# Patient Record
Sex: Female | Born: 1935 | Race: White | Hispanic: No | Smoking: Current every day smoker
Health system: Southern US, Community
[De-identification: ages and names within clinical notes are randomized; demographics above are authoritative.]

## PROBLEM LIST (undated history)

## (undated) DIAGNOSIS — M1990 Unspecified osteoarthritis, unspecified site: Secondary | ICD-10-CM

## (undated) DIAGNOSIS — J449 Chronic obstructive pulmonary disease, unspecified: Secondary | ICD-10-CM

## (undated) HISTORY — PX: EYE SURGERY: SHX253

---

## 2004-01-09 ENCOUNTER — Emergency Department (HOSPITAL_COMMUNITY): Admission: EM | Admit: 2004-01-09 | Discharge: 2004-01-09 | Payer: Self-pay | Admitting: Emergency Medicine

## 2011-08-22 ENCOUNTER — Encounter (HOSPITAL_COMMUNITY): Payer: Self-pay | Admitting: Emergency Medicine

## 2011-08-22 ENCOUNTER — Emergency Department (HOSPITAL_COMMUNITY): Payer: Medicare Other

## 2011-08-22 ENCOUNTER — Other Ambulatory Visit: Payer: Self-pay

## 2011-08-22 ENCOUNTER — Emergency Department (HOSPITAL_COMMUNITY)
Admission: EM | Admit: 2011-08-22 | Discharge: 2011-08-23 | Disposition: A | Discharge status: Home / Self Care | Payer: Medicare Other | Attending: Emergency Medicine | Admitting: Emergency Medicine

## 2011-08-22 DIAGNOSIS — J4489 Other specified chronic obstructive pulmonary disease: Secondary | ICD-10-CM | POA: Insufficient documentation

## 2011-08-22 DIAGNOSIS — R059 Cough, unspecified: Secondary | ICD-10-CM | POA: Insufficient documentation

## 2011-08-22 DIAGNOSIS — R509 Fever, unspecified: Secondary | ICD-10-CM | POA: Insufficient documentation

## 2011-08-22 DIAGNOSIS — R05 Cough: Secondary | ICD-10-CM | POA: Insufficient documentation

## 2011-08-22 DIAGNOSIS — R4182 Altered mental status, unspecified: Secondary | ICD-10-CM | POA: Insufficient documentation

## 2011-08-22 DIAGNOSIS — J40 Bronchitis, not specified as acute or chronic: Secondary | ICD-10-CM | POA: Insufficient documentation

## 2011-08-22 DIAGNOSIS — J449 Chronic obstructive pulmonary disease, unspecified: Secondary | ICD-10-CM | POA: Insufficient documentation

## 2011-08-22 DIAGNOSIS — F17200 Nicotine dependence, unspecified, uncomplicated: Secondary | ICD-10-CM | POA: Insufficient documentation

## 2011-08-22 DIAGNOSIS — R000 Tachycardia, unspecified: Secondary | ICD-10-CM | POA: Insufficient documentation

## 2011-08-22 DIAGNOSIS — H538 Other visual disturbances: Secondary | ICD-10-CM | POA: Insufficient documentation

## 2011-08-22 DIAGNOSIS — M542 Cervicalgia: Secondary | ICD-10-CM | POA: Insufficient documentation

## 2011-08-22 LAB — URINALYSIS, ROUTINE W REFLEX MICROSCOPIC
Glucose, UA: NEGATIVE mg/dL
Ketones, ur: NEGATIVE mg/dL
Nitrite: NEGATIVE
Protein, ur: NEGATIVE mg/dL
Specific Gravity, Urine: 1.012 (ref 1.005–1.030)

## 2011-08-22 LAB — CBC
HCT: 39 % (ref 36.0–46.0)
Hemoglobin: 14.1 g/dL (ref 12.0–15.0)
MCV: 93.5 fL (ref 78.0–100.0)
WBC: 8.6 10*3/uL (ref 4.0–10.5)

## 2011-08-22 LAB — COMPREHENSIVE METABOLIC PANEL
AST: 22 U/L (ref 0–37)
Albumin: 4.2 g/dL (ref 3.5–5.2)
BUN: 5 mg/dL — ABNORMAL LOW (ref 6–23)
CO2: 24 mEq/L (ref 19–32)
Calcium: 9.6 mg/dL (ref 8.4–10.5)
Chloride: 93 mEq/L — ABNORMAL LOW (ref 96–112)
Creatinine, Ser: 0.46 mg/dL — ABNORMAL LOW (ref 0.50–1.10)
GFR calc Af Amer: >90 mL/min (ref >90)
Glucose, Bld: 103 mg/dL — ABNORMAL HIGH (ref 70–99)
Sodium: 128 mEq/L — ABNORMAL LOW (ref 135–145)
Total Protein: 7.1 g/dL (ref 6.0–8.3)

## 2011-08-22 LAB — DIFFERENTIAL
Eosinophils Relative: 0 % (ref 0–5)
Lymphs Abs: 1.5 10*3/uL (ref 0.7–4.0)
Monocytes Relative: 12 % (ref 3–12)

## 2011-08-22 LAB — URINE MICROSCOPIC-ADD ON

## 2011-08-22 MED ORDER — LORAZEPAM 2 MG/ML IJ SOLN
1.0000 mg | Freq: Once | INTRAMUSCULAR | Status: AC
  Administered 2011-08-22: 1 mg via INTRAVENOUS
  Filled 2011-08-22: qty 1

## 2011-08-22 MED ORDER — SODIUM CHLORIDE 0.9 % IV BOLUS (SEPSIS)
1000.0000 mL | Freq: Once | INTRAVENOUS | Status: AC
  Administered 2011-08-22: 1000 mL via INTRAVENOUS

## 2011-08-22 NOTE — ED Notes (Signed)
MD at bedside. 

## 2011-08-22 NOTE — ED Notes (Addendum)
Pt c/o uncontrolled shaking and a pain in neck that start last night. Pt has been ill for over a month. Pt also c/o of blurred vision that started over a month ago.

## 2011-08-22 NOTE — ED Provider Notes (Signed)
History     CSN: [id number]  Arrival date & time 08/22/11  2026   First MD Initiated Contact with Patient 08/22/11 2137      Chief Complaint  Patient presents with  . Shaking  . Blurred Vision  . Neck Pain  . Shoulder Pain    Patient is a 77 y.o. female presenting with neck pain and shoulder pain.  Neck Pain   Shoulder Pain   The patient presents with several days of generalized discomfort and new chills.  She has a long history of visual issues, including cataracts, macular degeneration.  She notes over the past week she has had progressive decline in her visual capacity, without acute precipitant for alleviating factors. Several days ago she gradually became aware of generalized discomfort as well, notes tightness about her back and neck.  Today she developed chills. She has not taken any new medications recently, nor has she tried any medications to make to alleviate her symptoms. She denies any nausea, vomiting, diarrhea, dyspnea, chest pain.  No past medical history on file.  Past Surgical History  Procedure Date  . Eye surgery     No family history on file.  History  Substance Use Topics  . Smoking status: Current Everyday Smoker -- 0.5 packs/day for 60 years    Types: Cigarettes  . Smokeless tobacco: Former Neurosurgeon    Types: Snuff  . Alcohol Use: 4.2 oz/week    7 Cans of beer per week    OB History    Grav Para Term Preterm Abortions TAB SAB Ect Mult Living                  Review of Systems  Constitutional:       HPI  HENT: Positive for neck pain.        HPI otherwise negative  Eyes: Negative.   Respiratory:       HPI, otherwise negative  Cardiovascular:       HPI, otherwise nmegative  Gastrointestinal: Negative for vomiting.  Genitourinary:       HPI, otherwise negative  Musculoskeletal:       HPI, otherwise negative  Skin: Negative.   Neurological: Negative for syncope.    Allergies  Review of patient's allergies indicates no known  allergies.  Home Medications   Current Outpatient Rx  Name Route Sig Dispense Refill  . SIMVASTATIN 20 MG PO TABS Oral Take 20 mg by mouth every evening.    . ASPIRIN 325 MG PO TABS Oral Take 650 mg by mouth daily.      BP 184/89  Pulse 107  Temp(Src) 99.3 F (37.4 C) (Rectal)  Resp 22  Ht 5\' 4"  (1.626 m)  Wt 137 lb (62.143 kg)  BMI 23.52 kg/m2  SpO2 96%  Physical Exam  Nursing note and vitals reviewed. Constitutional: She is oriented to person, place, and time. She appears well-developed and well-nourished. No distress.  HENT:  Head: Normocephalic and atraumatic.  Eyes: Conjunctivae and EOM are normal. Pupils are equal, round, and reactive to light.  Cardiovascular: Regular rhythm.  Tachycardia present.   Pulmonary/Chest: Effort normal and breath sounds normal. No stridor. No respiratory distress.  Abdominal: She exhibits no distension.  Musculoskeletal: She exhibits no edema.  Neurological: She is alert and oriented to person, place, and time. No cranial nerve deficit. She exhibits normal muscle tone.  Skin: Skin is warm and dry.  Psychiatric: She has a normal mood and affect. Her speech is normal and behavior is  normal. Thought content normal.    ED Course  Procedures (including critical care time)  Labs Reviewed  GLUCOSE, CAPILLARY - Abnormal; Notable for the following:    Glucose-Capillary 131 (*)    All other components within normal limits  URINALYSIS, ROUTINE W REFLEX MICROSCOPIC  CBC  DIFFERENTIAL  COMPREHENSIVE METABOLIC PANEL  TROPONIN I   No results found.   No diagnosis found.   Date: 08/22/2011  Rate: 108  Rhythm: sinus tachycardia  QRS Axis: normal  Intervals: normal  ST/T Wave abnormalities: nonspecific T wave changes  Conduction Disutrbances:none  Narrative Interpretation:   Old EKG Reviewed: none available ABNORMAL  C-monitor: 95-sr, normal Pulse ox 99% ra- normal  Ct, cxr both reviewed by me.  The patient was encouraged to  continue her efforts to stop smoking MDM  This 6 female with a history of macular degeneration now presents with chills, generalized discomfort.  On exam the patient is alert, oriented appropriately, has mild diaphoresis, has cough.  The patient's x-rays consistent with acute bronchitis.  Given her smoking history, there is concern for acute COPD exacerbation.  The patient was discharged with steroids, antibiotics, albuterol inhaler to follow up with her primary care physician.  Gerhard Munch, MD 08/23/11 [phone]

## 2011-08-23 MED ORDER — AZITHROMYCIN 250 MG PO TABS
500.0000 mg | ORAL_TABLET | Freq: Once | ORAL | Status: AC
  Administered 2011-08-23: 500 mg via ORAL
  Filled 2011-08-23: qty 2

## 2011-08-23 MED ORDER — PREDNISONE 10 MG PO TABS: 60.0000 mg | ORAL_TABLET | Freq: Every day | ORAL | Status: AC

## 2011-08-23 MED ORDER — IOHEXOL 300 MG/ML  SOLN
100.0000 mL | Freq: Once | INTRAMUSCULAR | Status: AC | PRN
  Administered 2011-08-23: 100 mL via INTRAVENOUS

## 2011-08-23 MED ORDER — AZITHROMYCIN 250 MG PO TABS: 250.0000 mg | ORAL_TABLET | Freq: Every day | ORAL | Status: AC

## 2011-08-23 MED ORDER — PREDNISONE 20 MG PO TABS
60.0000 mg | ORAL_TABLET | ORAL | Status: AC
  Administered 2011-08-23: 60 mg via ORAL
  Filled 2011-08-23: qty 3

## 2011-08-23 MED ORDER — ALBUTEROL SULFATE HFA 108 (90 BASE) MCG/ACT IN AERS
1.0000 | INHALATION_SPRAY | RESPIRATORY_TRACT | Status: DC | PRN
  Administered 2011-08-23: 1 via RESPIRATORY_TRACT
  Filled 2011-08-23: qty 6.7

## 2011-08-23 NOTE — Discharge Instructions (Signed)

## 2011-08-23 NOTE — ED Notes (Signed)
Patient transported to CT 

## 2014-07-22 ENCOUNTER — Emergency Department (HOSPITAL_COMMUNITY): Payer: Medicare HMO

## 2014-07-22 ENCOUNTER — Emergency Department (HOSPITAL_COMMUNITY)
Admission: EM | Admit: 2014-07-22 | Discharge: 2014-07-22 | Disposition: A | Discharge status: Home / Self Care | Payer: Medicare HMO | Attending: Emergency Medicine | Admitting: Emergency Medicine

## 2014-07-22 ENCOUNTER — Encounter (HOSPITAL_COMMUNITY): Payer: Self-pay | Admitting: Emergency Medicine

## 2014-07-22 DIAGNOSIS — Z720 Tobacco use: Secondary | ICD-10-CM | POA: Insufficient documentation

## 2014-07-22 DIAGNOSIS — Y9389 Activity, other specified: Secondary | ICD-10-CM | POA: Diagnosis not present

## 2014-07-22 DIAGNOSIS — S0990XA Unspecified injury of head, initial encounter: Secondary | ICD-10-CM

## 2014-07-22 DIAGNOSIS — Y998 Other external cause status: Secondary | ICD-10-CM | POA: Diagnosis not present

## 2014-07-22 DIAGNOSIS — Z7982 Long term (current) use of aspirin: Secondary | ICD-10-CM | POA: Insufficient documentation

## 2014-07-22 DIAGNOSIS — S0181XA Laceration without foreign body of other part of head, initial encounter: Secondary | ICD-10-CM | POA: Diagnosis not present

## 2014-07-22 DIAGNOSIS — Y92410 Unspecified street and highway as the place of occurrence of the external cause: Secondary | ICD-10-CM | POA: Insufficient documentation

## 2014-07-22 MED ORDER — ACETAMINOPHEN 500 MG PO TABS
1000.0000 mg | ORAL_TABLET | Freq: Once | ORAL | Status: AC
  Administered 2014-07-22: 1000 mg via ORAL
  Filled 2014-07-22: qty 2

## 2014-07-22 MED ORDER — HYDROCODONE-ACETAMINOPHEN 5-325 MG PO TABS: 1.0000 | ORAL_TABLET | Freq: Four times a day (QID) | ORAL | Status: DC | PRN

## 2014-07-22 MED ORDER — LIDOCAINE-EPINEPHRINE 2 %-1:100000 IJ SOLN
20.0000 mL | Freq: Once | INTRAMUSCULAR | Status: AC
  Administered 2014-07-22: 20 mL via INTRADERMAL
  Filled 2014-07-22: qty 1

## 2014-07-22 NOTE — Discharge Instructions (Signed)
Facial Laceration ° A facial laceration is a cut on the face. These injuries can be painful and cause bleeding. Lacerations usually heal quickly, but they need special care to reduce scarring. °DIAGNOSIS  °Your health care provider will take a medical history, ask for details about how the injury occurred, and examine the wound to determine how deep the cut is. °TREATMENT  °Some facial lacerations may not require closure. Others may not be able to be closed because of an increased risk of infection. The risk of infection and the chance for successful closure will depend on various factors, including the amount of time since the injury occurred. °The wound may be cleaned to help prevent infection. If closure is appropriate, pain medicines may be given if needed. Your health care provider will use stitches (sutures), wound glue (adhesive), or skin adhesive strips to repair the laceration. These tools bring the skin edges together to allow for faster healing and a better cosmetic outcome. If needed, you may also be given a tetanus shot. °HOME CARE INSTRUCTIONS °· Only take over-the-counter or prescription medicines as directed by your health care provider. °· Follow your health care provider's instructions for wound care. These instructions will vary depending on the technique used for closing the wound. °For Sutures: °· Keep the wound clean and dry.   °· If you were given a bandage (dressing), you should change it at least once a day. Also change the dressing if it becomes wet or dirty, or as directed by your health care provider.   °· Wash the wound with soap and water 2 times a day. Rinse the wound off with water to remove all soap. Pat the wound dry with a clean towel.   °· After cleaning, apply a thin layer of the antibiotic ointment recommended by your health care provider. This will help prevent infection and keep the dressing from sticking.   °· You may shower as usual after the first 24 hours. Do not soak the  wound in water until the sutures are removed.   °· Get your sutures removed as directed by your health care provider. With facial lacerations, sutures should usually be taken out after 4-5 days to avoid stitch marks.   °· Wait a few days after your sutures are removed before applying any makeup. °For Skin Adhesive Strips: °· Keep the wound clean and dry.   °· Do not get the skin adhesive strips wet. You may bathe carefully, using caution to keep the wound dry.   °· If the wound gets wet, pat it dry with a clean towel.   °· Skin adhesive strips will fall off on their own. You may trim the strips as the wound heals. Do not remove skin adhesive strips that are still stuck to the wound. They will fall off in time.   °For Wound Adhesive: °· You may briefly wet your wound in the shower or bath. Do not soak or scrub the wound. Do not swim. Avoid periods of heavy sweating until the skin adhesive has fallen off on its own. After showering or bathing, gently pat the wound dry with a clean towel.   °· Do not apply liquid medicine, cream medicine, ointment medicine, or makeup to your wound while the skin adhesive is in place. This may loosen the film before your wound is healed.   °· If a dressing is placed over the wound, be careful not to apply tape directly over the skin adhesive. This may cause the adhesive to be pulled off before the wound is healed.   °· Avoid   prolonged exposure to sunlight or tanning lamps while the skin adhesive is in place.  The skin adhesive will usually remain in place for 5-10 days, then naturally fall off the skin. Do not pick at the adhesive film.  After Healing: Once the wound has healed, cover the wound with sunscreen during the day for 1 full year. This can help minimize scarring. Exposure to ultraviolet light in the first year will darken the scar. It can take 1-2 years for the scar to lose its redness and to heal completely.  SEEK IMMEDIATE MEDICAL CARE IF:  You have redness, pain, or  swelling around the wound.   You see ayellowish-white fluid (pus) coming from the wound.   You have chills or a fever.  MAKE SURE YOU:  Understand these instructions.  Will watch your condition.  Will get help right away if you are not doing well or get worse. Document Released: 07/15/2004 Document Revised: 03/28/2013 Document Reviewed: 01/18/2013 [facility] Patient Information 2015 [place], [place]. This information is not intended to replace advice given to you by your health care provider. Make sure you discuss any questions you have with your health care provider.  Head Injury You have received a head injury. It does not appear serious at this time. Headaches and vomiting are common following head injury. It should be easy to awaken from sleeping. Sometimes it is necessary for you to stay in the emergency department for a while for observation. Sometimes admission to the hospital may be needed. After injuries such as yours, most problems occur within the first 24 hours, but side effects may occur up to 7-10 days after the injury. It is important for you to carefully monitor your condition and contact your health care provider or seek immediate medical care if there is a change in your condition. WHAT ARE THE TYPES OF HEAD INJURIES? Head injuries can be as minor as a bump. Some head injuries can be more severe. More severe head injuries include:  A jarring injury to the brain (concussion).  A bruise of the brain (contusion). This mean there is bleeding in the brain that can cause swelling.  A cracked skull (skull fracture).  Bleeding in the brain that collects, clots, and forms a bump (hematoma). WHAT CAUSES A HEAD INJURY? A serious head injury is most likely to happen to someone who is in a car wreck and is not wearing a seat belt. Other causes of major head injuries include bicycle or motorcycle accidents, sports injuries, and falls. HOW ARE HEAD INJURIES DIAGNOSED? A complete  history of the event leading to the injury and your current symptoms will be helpful in diagnosing head injuries. Many times, pictures of the brain, such as CT or MRI are needed to see the extent of the injury. Often, an overnight hospital stay is necessary for observation.  WHEN SHOULD I SEEK IMMEDIATE MEDICAL CARE?  You should get help right away if:  You have confusion or drowsiness.  You feel sick to your stomach (nauseous) or have continued, forceful vomiting.  You have dizziness or unsteadiness that is getting worse.  You have severe, continued headaches not relieved by medicine. Only take over-the-counter or prescription medicines for pain, fever, or discomfort as directed by your health care provider.  You do not have normal function of the arms or legs or are unable to walk.  You notice changes in the black spots in the center of the colored part of your eye (pupil).  You have a clear  or bloody fluid coming from your nose or ears. °· You have a loss of vision. °During the next 24 hours after the injury, you must stay with someone who can watch you for the warning signs. This person should contact local emergency services (911 in the [place] if you have seizures, you become unconscious, or you are unable to wake up. °HOW CAN I PREVENT A HEAD INJURY IN THE FUTURE? °The most important factor for preventing major head injuries is avoiding motor vehicle accidents.  To minimize the potential for damage to your head, it is crucial to wear seat belts while riding in motor vehicles. Wearing helmets while bike riding and playing collision sports (like football) is also helpful. Also, avoiding dangerous activities around the house will further help reduce your risk of head injury.  °WHEN CAN I RETURN TO NORMAL ACTIVITIES AND ATHLETICS? °You should be reevaluated by your health care provider before returning to these activities. If you have any of the following symptoms, you should not return to  activities or contact sports until 1 week after the symptoms have stopped: °· Persistent headache. °· Dizziness or vertigo. °· Poor attention and concentration. °· Confusion. °· Memory problems. °· Nausea or vomiting. °· Fatigue or tire easily. °· Irritability. °· Intolerant of bright lights or loud noises. °· Anxiety or depression. °· Disturbed sleep. °MAKE SURE YOU:  °· Understand these instructions. °· Will watch your condition. °· Will get help right away if you are not doing well or get worse. °Document Released: 06/07/2005 Document Revised: 06/12/2013 Document Reviewed: 02/12/2013 °[employer]® Patient Information ©2015 [employer]. This information is not intended to replace advice given to you by your health care provider. Make sure you discuss any questions you have with your health care provider. ° °Motor Vehicle Collision °It is common to have multiple bruises and sore muscles after a motor vehicle collision (MVC). These tend to feel worse for the first 24 hours. You may have the most stiffness and soreness over the first several hours. You may also feel worse when you wake up the first morning after your collision. After this point, you will usually begin to improve with each day. The speed of improvement often depends on the severity of the collision, the number of injuries, and the location and nature of these injuries. °HOME CARE INSTRUCTIONS °· Put ice on the injured area. °¨ Put ice in a plastic bag. °¨ Place a towel between your skin and the bag. °¨ Leave the ice on for 15-20 minutes, 3-4 times a day, or as directed by your health care provider. °· Drink enough fluids to keep your urine clear or pale yellow. Do not drink alcohol. °· Take a warm shower or bath once or twice a day. This will increase blood flow to sore muscles. °· You may return to activities as directed by your caregiver. Be careful when lifting, as this may aggravate neck or back pain. °· Only take over-the-counter or prescription  medicines for pain, discomfort, or fever as directed by your caregiver. Do not use aspirin. This may increase bruising and bleeding. °SEEK IMMEDIATE MEDICAL CARE IF: °· You have numbness, tingling, or weakness in the arms or legs. °· You develop severe headaches not relieved with medicine. °· You have severe neck pain, especially tenderness in the middle of the back of your neck. °· You have changes in bowel or bladder control. °· There is increasing pain in any area of the body. °· You have shortness of breath, light-headedness,   dizziness, or fainting.  You have chest pain.  You feel sick to your stomach (nauseous), throw up (vomit), or sweat.  You have increasing abdominal discomfort.  There is blood in your urine, stool, or vomit.  You have pain in your shoulder (shoulder strap areas).  You feel your symptoms are getting worse. MAKE SURE YOU:  Understand these instructions.  Will watch your condition.  Will get help right away if you are not doing well or get worse. Document Released: 06/07/2005 Document Revised: 10/22/2013 Document Reviewed: 11/04/2010 [facility] Patient Information 2015 [place], [place]. This information is not intended to replace advice given to you by your health care provider. Make sure you discuss any questions you have with your health care provider.

## 2014-07-22 NOTE — ED Notes (Signed)
Patient transported to CT 
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
## 2018-02-07 ENCOUNTER — Other Ambulatory Visit: Payer: Self-pay

## 2018-02-07 ENCOUNTER — Inpatient Hospital Stay (HOSPITAL_BASED_OUTPATIENT_CLINIC_OR_DEPARTMENT_OTHER)
Admission: EM | Admit: 2018-02-07 | Discharge: 2018-02-11 | DRG: 871 | Disposition: A | Discharge status: Home Health | Payer: Medicare HMO | Attending: Internal Medicine | Admitting: Internal Medicine

## 2018-02-07 ENCOUNTER — Emergency Department (HOSPITAL_BASED_OUTPATIENT_CLINIC_OR_DEPARTMENT_OTHER): Payer: Medicare HMO

## 2018-02-07 ENCOUNTER — Encounter (HOSPITAL_BASED_OUTPATIENT_CLINIC_OR_DEPARTMENT_OTHER): Payer: Self-pay | Admitting: Emergency Medicine

## 2018-02-07 DIAGNOSIS — A419 Sepsis, unspecified organism: Secondary | ICD-10-CM | POA: Diagnosis present

## 2018-02-07 DIAGNOSIS — J441 Chronic obstructive pulmonary disease with (acute) exacerbation: Secondary | ICD-10-CM | POA: Diagnosis present

## 2018-02-07 DIAGNOSIS — E440 Moderate protein-calorie malnutrition: Secondary | ICD-10-CM | POA: Diagnosis present

## 2018-02-07 DIAGNOSIS — J440 Chronic obstructive pulmonary disease with acute lower respiratory infection: Secondary | ICD-10-CM | POA: Diagnosis present

## 2018-02-07 DIAGNOSIS — L89152 Pressure ulcer of sacral region, stage 2: Secondary | ICD-10-CM | POA: Diagnosis present

## 2018-02-07 DIAGNOSIS — Z7982 Long term (current) use of aspirin: Secondary | ICD-10-CM | POA: Diagnosis not present

## 2018-02-07 DIAGNOSIS — J181 Lobar pneumonia, unspecified organism: Secondary | ICD-10-CM | POA: Diagnosis present

## 2018-02-07 DIAGNOSIS — R000 Tachycardia, unspecified: Secondary | ICD-10-CM | POA: Diagnosis not present

## 2018-02-07 DIAGNOSIS — Z681 Body mass index (BMI) 19 or less, adult: Secondary | ICD-10-CM | POA: Diagnosis not present

## 2018-02-07 DIAGNOSIS — J9601 Acute respiratory failure with hypoxia: Secondary | ICD-10-CM | POA: Diagnosis present

## 2018-02-07 DIAGNOSIS — B373 Candidiasis of vulva and vagina: Secondary | ICD-10-CM | POA: Diagnosis present

## 2018-02-07 DIAGNOSIS — R0602 Shortness of breath: Secondary | ICD-10-CM | POA: Diagnosis present

## 2018-02-07 DIAGNOSIS — F17210 Nicotine dependence, cigarettes, uncomplicated: Secondary | ICD-10-CM | POA: Diagnosis present

## 2018-02-07 DIAGNOSIS — L89322 Pressure ulcer of left buttock, stage 2: Secondary | ICD-10-CM | POA: Diagnosis present

## 2018-02-07 DIAGNOSIS — J189 Pneumonia, unspecified organism: Secondary | ICD-10-CM | POA: Diagnosis present

## 2018-02-07 DIAGNOSIS — L89312 Pressure ulcer of right buttock, stage 2: Secondary | ICD-10-CM | POA: Diagnosis present

## 2018-02-07 DIAGNOSIS — L8990 Pressure ulcer of unspecified site, unspecified stage: Secondary | ICD-10-CM

## 2018-02-07 HISTORY — DX: Chronic obstructive pulmonary disease, unspecified: J44.9

## 2018-02-07 HISTORY — DX: Unspecified osteoarthritis, unspecified site: M19.90

## 2018-02-07 LAB — URINALYSIS, ROUTINE W REFLEX MICROSCOPIC
Glucose, UA: NEGATIVE mg/dL
KETONES UR: 15 mg/dL — AB
Nitrite: NEGATIVE
PH: 6 (ref 5.0–8.0)
Protein, ur: 100 mg/dL — AB
Specific Gravity, Urine: >1.03 — ABNORMAL HIGH (ref 1.005–1.030)

## 2018-02-07 LAB — COMPREHENSIVE METABOLIC PANEL
ALT: 15 U/L (ref 0–44)
ANION GAP: 11 (ref 5–15)
AST: 24 U/L (ref 15–41)
Albumin: 3.4 g/dL — ABNORMAL LOW (ref 3.5–5.0)
Alkaline Phosphatase: 77 U/L (ref 38–126)
BUN: 18 mg/dL (ref 8–23)
CHLORIDE: 102 mmol/L (ref 98–111)
CO2: 25 mmol/L (ref 22–32)
Calcium: 8.7 mg/dL — ABNORMAL LOW (ref 8.9–10.3)
Creatinine, Ser: 0.74 mg/dL (ref 0.44–1.00)
Glucose, Bld: 176 mg/dL — ABNORMAL HIGH (ref 70–99)
Potassium: 3.5 mmol/L (ref 3.5–5.1)
SODIUM: 138 mmol/L (ref 135–145)
Total Bilirubin: 0.9 mg/dL (ref 0.3–1.2)
Total Protein: 7.2 g/dL (ref 6.5–8.1)

## 2018-02-07 LAB — CBC WITH DIFFERENTIAL/PLATELET
Basophils Absolute: 0 10*3/uL (ref 0.0–0.1)
Basophils Relative: 0 %
EOS ABS: 0 10*3/uL (ref 0.0–0.7)
EOS PCT: 0 %
HCT: 38.3 % (ref 36.0–46.0)
HEMOGLOBIN: 13.4 g/dL (ref 12.0–15.0)
LYMPHS ABS: 0.8 10*3/uL (ref 0.7–4.0)
LYMPHS PCT: 7 %
MCH: 32.4 pg (ref 26.0–34.0)
MCHC: 35 g/dL (ref 30.0–36.0)
MCV: 92.7 fL (ref 78.0–100.0)
Monocytes Absolute: 1.4 10*3/uL — ABNORMAL HIGH (ref 0.1–1.0)
Monocytes Relative: 12 %
NEUTROS PCT: 81 %
Neutro Abs: 9.3 10*3/uL — ABNORMAL HIGH (ref 1.7–7.7)
PLATELETS: 257 10*3/uL (ref 150–400)
RBC: 4.13 MIL/uL (ref 3.87–5.11)
RDW: 12.9 % (ref 11.5–15.5)
WBC: 11.5 10*3/uL — AB (ref 4.0–10.5)

## 2018-02-07 LAB — PROTIME-INR
INR: 1.06
PROTHROMBIN TIME: 13.7 s (ref 11.4–15.2)

## 2018-02-07 LAB — URINALYSIS, MICROSCOPIC (REFLEX)

## 2018-02-07 LAB — I-STAT CG4 LACTIC ACID, ED: LACTIC ACID, VENOUS: 1.62 mmol/L (ref 0.5–1.9)

## 2018-02-07 MED ORDER — AZITHROMYCIN 500 MG IV SOLR
INTRAVENOUS | Status: AC
  Filled 2018-02-07: qty 500

## 2018-02-07 MED ORDER — SODIUM CHLORIDE 0.9 % IV SOLN
INTRAVENOUS | Status: DC | PRN
  Administered 2018-02-07: 1000 mL via INTRAVENOUS

## 2018-02-07 MED ORDER — IPRATROPIUM-ALBUTEROL 0.5-2.5 (3) MG/3ML IN SOLN
3.0000 mL | RESPIRATORY_TRACT | Status: AC
  Administered 2018-02-07 (×2): 3 mL via RESPIRATORY_TRACT
  Filled 2018-02-07: qty 9

## 2018-02-07 MED ORDER — SODIUM CHLORIDE 0.9 % IV SOLN
500.0000 mg | Freq: Once | INTRAVENOUS | Status: AC
  Administered 2018-02-07: 500 mg via INTRAVENOUS
  Filled 2018-02-07: qty 500

## 2018-02-07 MED ORDER — SODIUM CHLORIDE 0.9 % IV BOLUS (SEPSIS)
500.0000 mL | Freq: Once | INTRAVENOUS | Status: AC
  Administered 2018-02-07: 500 mL via INTRAVENOUS

## 2018-02-07 MED ORDER — SODIUM CHLORIDE 0.9 % IV SOLN
1.0000 g | Freq: Once | INTRAVENOUS | Status: AC
  Administered 2018-02-07: 1 g via INTRAVENOUS
  Filled 2018-02-07: qty 10

## 2018-02-07 MED ORDER — SODIUM CHLORIDE 0.9 % IV BOLUS (SEPSIS)
1000.0000 mL | Freq: Once | INTRAVENOUS | Status: AC
  Administered 2018-02-07: 1000 mL via INTRAVENOUS

## 2018-02-07 MED ORDER — OXYCODONE HCL 5 MG PO TABS
2.5000 mg | ORAL_TABLET | Freq: Once | ORAL | Status: AC
  Administered 2018-02-07: 2.5 mg via ORAL
  Filled 2018-02-07: qty 1

## 2018-02-07 MED ORDER — ACETAMINOPHEN 500 MG PO TABS
1000.0000 mg | ORAL_TABLET | Freq: Once | ORAL | Status: AC
  Administered 2018-02-07: 1000 mg via ORAL
  Filled 2018-02-07: qty 2

## 2018-02-07 NOTE — ED Notes (Signed)
Oxygen decreased to 2L-oxygen saturation 92%

## 2018-02-07 NOTE — ED Notes (Addendum)
Oxygen saturation 81% on RA.  Placed on [place] @ 3L. O2 @ 93% on 3L [place]

## 2018-02-07 NOTE — ED Triage Notes (Addendum)
Shortness of breath since Friday.  Patient with fever per granddaughter.  Patient current smoker-1/2 pack day for 60-70 years.  Patient alert and oriented at present.  Oxygen saturation 85% at triage.  Patient taken directly to room 12.  Productive cough with thick yellow phlegm with lack of appetite.

## 2018-02-07 NOTE — ED Notes (Signed)
Pt remains adamant about being d/c home and not being admitted to the hospital even after ambulating with SpO2 at 79% and only being able to speak in 1-2 word sentences. Pt's family at bedside at this time discussing the options with her.

## 2018-02-07 NOTE — Progress Notes (Signed)
83 yo female with probable Copd on CXR, has  cough x3 days, pox 79% on RA.  Pt tachycardic, febrile 100.4,  Wbc 11.5 .  Due to her age and hypoxia, and fever, leukocytosis and tachycardia, will admit inpatient for CAP.

## 2018-02-07 NOTE — ED Notes (Signed)
ED Provider at bedside. 

## 2018-02-07 NOTE — Progress Notes (Signed)
Patient ambulated to the bathroom while on pulse ox.  Patient's SPO2 dropped to 79% and her HR increased to 144.  Patient was escorted back to her room and placed back on a nasal cannula.

## 2018-02-07 NOTE — ED Provider Notes (Signed)
[facility] EMERGENCY DEPARTMENT Provider Note   CSN: [id number] Arrival date & time: 02/07/18  1643     History   Chief Complaint Chief Complaint  Patient presents with  . Shortness of Breath    HPI Jenna Carson is a 83 y.o. female.  The history is provided by the patient.  Illness  This is a new problem. The current episode started more than 2 days ago. The problem occurs constantly. The problem has been gradually worsening. Associated symptoms include shortness of breath. Pertinent negatives include no chest pain, no abdominal pain and no headaches. Nothing aggravates the symptoms. Nothing relieves the symptoms. She has tried nothing for the symptoms. The treatment provided no relief.    Past Medical History:  Diagnosis Date  . Arthritis   . COPD (chronic obstructive pulmonary disease) [facility](HCC)     Patient Active Problem List   Diagnosis Date Noted  . CAP (community acquired pneumonia) 02/07/2018    Past Surgical History:  Procedure Laterality Date  . EYE SURGERY       OB History   None      Home Medications    Prior to Admission medications   Medication Sig Start Date End Date Taking? Authorizing Provider  acetaminophen-codeine (TYLENOL #3) 300-30 MG per tablet Take 2 tablets by mouth at bedtime as needed for moderate pain.    [provider]  albuterol (PROVENTIL HFA;VENTOLIN HFA) 108 (90 BASE) MCG/ACT inhaler Inhale 2 puffs into the lungs every 6 (six) hours as needed. Wheezing and shortness of breath    [provider]  aspirin 325 MG tablet Take 325 mg by mouth daily as needed for headache.     [provider]  HYDROcodone-acetaminophen (NORCO/VICODIN) 5-325 MG per tablet Take 1 tablet by mouth every 6 (six) hours as needed. 07/22/14   Ward, Layla MawKristen N, DO  LORazepam (ATIVAN) 0.5 MG tablet Take 0.5 mg by mouth 2 (two) times daily as needed for anxiety.    [provider]  vitamin E 1000 UNIT capsule Take 2,000  Units by mouth daily.    [provider]    Family History History reviewed. No pertinent family history.  Social History Social History   Tobacco Use  . Smoking status: Current Every Day Smoker    Packs/day: 0.50    Years: 60.00    Pack years: 30.00    Types: Cigarettes  . Smokeless tobacco: Former NeurosurgeonUser    Types: Snuff  Substance Use Topics  . Alcohol use: Yes    Alcohol/week: 7.0 standard drinks    Types: 7 Cans of beer per week  . Drug use: No     Allergies   Patient has no known allergies.   Review of Systems Review of Systems  Constitutional: Negative for chills and fever.  HENT: Negative for congestion and rhinorrhea.   Eyes: Negative for redness and visual disturbance.  Respiratory: Positive for shortness of breath. Negative for wheezing.   Cardiovascular: Negative for chest pain and palpitations.  Gastrointestinal: Negative for abdominal pain, nausea and vomiting.  Genitourinary: Negative for dysuria and urgency.  Musculoskeletal: Negative for arthralgias and myalgias.  Skin: Negative for pallor and wound.  Neurological: Negative for dizziness and headaches.     Physical Exam Updated Vital Signs BP 132/67   Pulse (!) 102   Temp (!) 100.4 F (38 C) (Rectal)   Resp (!) 22   Wt 46.3 kg   SpO2 97%   BMI 17.51 kg/m   Physical  Exam  Constitutional: She is oriented to person, place, and time. She appears well-developed and well-nourished. No distress.  HENT:  Head: Normocephalic and atraumatic.  Eyes: Pupils are equal, round, and reactive to light. EOM are normal.  Neck: Normal range of motion. Neck supple.  Cardiovascular: Normal rate and regular rhythm. Exam reveals no gallop and no friction rub.  No murmur heard. Pulmonary/Chest: Effort normal. She has wheezes (diffusely with prolonged expiration). She has no rales.  Diminished breath sounds with rhonchi to the LLL  Abdominal: Soft. She exhibits no distension and no mass. There is no  tenderness. There is no guarding.  Musculoskeletal: She exhibits no edema or tenderness.  Neurological: She is alert and oriented to person, place, and time.  Skin: Skin is warm and dry. She is not diaphoretic.  Psychiatric: She has a normal mood and affect. Her behavior is normal.  Nursing note and vitals reviewed.    ED Treatments / Results  Labs (all labs ordered are listed, but only abnormal results are displayed) Labs Reviewed  COMPREHENSIVE METABOLIC PANEL - Abnormal; Notable for the following components:      Result Value   Glucose, Bld 176 (*)    Calcium 8.7 (*)    Albumin 3.4 (*)    All other components within normal limits  CBC WITH DIFFERENTIAL/PLATELET - Abnormal; Notable for the following components:   WBC 11.5 (*)    Neutro Abs 9.3 (*)    Monocytes Absolute 1.4 (*)    All other components within normal limits  URINALYSIS, ROUTINE W REFLEX MICROSCOPIC - Abnormal; Notable for the following components:   APPearance CLOUDY (*)    Specific Gravity, Urine >1.030 (*)    Hgb urine dipstick SMALL (*)    Bilirubin Urine SMALL (*)    Ketones, ur 15 (*)    Protein, ur 100 (*)    Leukocytes, UA SMALL (*)    All other components within normal limits  URINALYSIS, MICROSCOPIC (REFLEX) - Abnormal; Notable for the following components:   Bacteria, UA FEW (*)    All other components within normal limits  CULTURE, BLOOD (ROUTINE X 2)  CULTURE, BLOOD (ROUTINE X 2)  URINE CULTURE  PROTIME-INR  I-STAT CG4 LACTIC ACID, ED    EKG EKG Interpretation  Date/Time:  Tuesday February 07 2018 16:53:01 EDT Ventricular Rate:  142 PR Interval:    QRS Duration: 83 QT Interval:  303 QTC Calculation: 466 R Axis:   56 Text Interpretation:  Supraventricular tachycardia Low voltage, extremity leads Since last tracing rate faster Otherwise no significant change Confirmed by Melene Plan [phone]) on 02/07/2018 4:57:45 PM   Radiology Dg Chest Portable 1 View  Result Date: 02/07/2018 CLINICAL  DATA:  83 y/o  F; 4 days of shortness of breath. EXAM: PORTABLE CHEST 1 VIEW COMPARISON:  08/22/2011 chest radiograph. FINDINGS: Normal cardiac silhouette. Aortic atherosclerosis with calcification. Left lower lung zone streaky consolidation probably representing pneumonia. Stable hyperinflation of lungs and emphysema. No pleural effusion or pneumothorax. No acute osseous abnormality is evident. IMPRESSION: COPD. Left lower lung zone consolidation likely representing pneumonia. Followup PA and lateral chest X-ray is recommended in 3-4 weeks following trial of antibiotic therapy to ensure resolution and exclude underlying malignancy. Electronically Signed   By: Mitzi Hansen M.D.   On: 02/07/2018 17:39    Procedures Procedures (including critical care time)  Medications Ordered in ED Medications  0.9 %  sodium chloride infusion ( Intravenous Stopped 02/07/18 2014)  azithromycin (ZITHROMAX) 500 MG injection (has  no administration in time range)  sodium chloride 0.9 % bolus 1,000 mL (0 mLs Intravenous Stopped 02/07/18 1812)    And  sodium chloride 0.9 % bolus 500 mL (0 mLs Intravenous Stopped 02/07/18 1812)  cefTRIAXone (ROCEPHIN) 1 g in sodium chloride 0.9 % 100 mL IVPB (0 g Intravenous Stopped 02/07/18 1857)  azithromycin (ZITHROMAX) 500 mg in sodium chloride 0.9 % 250 mL IVPB ( Intravenous Stopped 02/07/18 2010)  ipratropium-albuterol (DUONEB) 0.5-2.5 (3) MG/3ML nebulizer solution 3 mL (3 mLs Nebulization Given 02/07/18 1815)  acetaminophen (TYLENOL) tablet 1,000 mg (1,000 mg Oral Given 02/07/18 2034)  oxyCODONE (Oxy IR/ROXICODONE) immediate release tablet 2.5 mg (2.5 mg Oral Given 02/07/18 2035)     Initial Impression / Assessment and Plan / ED Course  I have reviewed the triage vital signs and the nursing notes.  Pertinent labs & imaging results that were available during my care of the patient were reviewed by me and considered in my medical decision making (see chart for details).       29 y oF with a chief complaint of cough.  Going on for the past 4 days.  Having increased sputum and change in sputum.  She denies history of COPD though has been prescribed inhalers in the past that she did not use.  She was found to have an O2 saturation in the upper 70s on arrival.  Improved with 2 L of oxygen.  She is tachypneic with prolonged expiration and diffuse wheezes.  She is declining breathing treatments.  She is tachycardic into the 140s.  Her blood pressure is mildly hypertensive, she is febrile in the ED rectally, her lactate was normal.  Will obtain blood cultures chest x-ray urine.  Chest x-ray with left lower lobe infiltrate is viewed by me.  UA negative for infection metabolic panel with mild elevation of glucose.  Leukocytosis of 11.5.  I discussed the results with the patient.  She is somewhat reluctant to be admitted to the hospital.  I had her ambulate and after a few steps she became extremely short of breath and her oxygen saturation went down to the 70's.    CRITICAL CARE Performed by: Rae Roam   Total critical care time: 35 minutes  Critical care time was exclusive of separately billable procedures and treating other patients.  Critical care was necessary to treat or prevent imminent or life-threatening deterioration.  Critical care was time spent personally by me on the following activities: development of treatment plan with patient and/or surrogate as well as nursing, discussions with consultants, evaluation of patient's response to treatment, examination of patient, obtaining history from patient or surrogate, ordering and performing treatments and interventions, ordering and review of laboratory studies, ordering and review of radiographic studies, pulse oximetry and re-evaluation of patient's condition.  The patients results and plan were reviewed and discussed.   Any x-rays performed were independently reviewed by myself.   Differential diagnosis  were considered with the presenting HPI.  Medications  0.9 %  sodium chloride infusion ( Intravenous Stopped 02/07/18 2014)  azithromycin (ZITHROMAX) 500 MG injection (has no administration in time range)  sodium chloride 0.9 % bolus 1,000 mL (0 mLs Intravenous Stopped 02/07/18 1812)    And  sodium chloride 0.9 % bolus 500 mL (0 mLs Intravenous Stopped 02/07/18 1812)  cefTRIAXone (ROCEPHIN) 1 g in sodium chloride 0.9 % 100 mL IVPB (0 g Intravenous Stopped 02/07/18 1857)  azithromycin (ZITHROMAX) 500 mg in sodium chloride 0.9 % 250 mL IVPB (  Intravenous Stopped 02/07/18 2010)  ipratropium-albuterol (DUONEB) 0.5-2.5 (3) MG/3ML nebulizer solution 3 mL (3 mLs Nebulization Given 02/07/18 1815)  acetaminophen (TYLENOL) tablet 1,000 mg (1,000 mg Oral Given 02/07/18 2034)  oxyCODONE (Oxy IR/ROXICODONE) immediate release tablet 2.5 mg (2.5 mg Oral Given 02/07/18 2035)    Vitals:   02/07/18 2130 02/07/18 2200 02/07/18 2214 02/07/18 2300  BP: 125/64  117/66 132/67  Pulse: (!) 107 (!) 101 (!) 104 (!) 102  Resp: 18 20 20  (!) 22  Temp:      TempSrc:      SpO2: 95% 98% 96% 97%  Weight:        Final diagnoses:  Community acquired pneumonia of left lower lobe of lung (HCC)  Acute respiratory failure with hypoxia (HCC)    Admission/ observation were discussed with the admitting physician, patient and/or family and they are comfortable with the plan.    Final Clinical Impressions(s) / ED Diagnoses   Final diagnoses:  Community acquired pneumonia of left lower lobe of lung (HCC)  Acute respiratory failure with hypoxia [facility](HCC)    ED Discharge Orders    None       Melene PlanFloyd, Jhace Fennell, DO 02/07/18 2323

## 2018-02-08 DIAGNOSIS — R000 Tachycardia, unspecified: Secondary | ICD-10-CM | POA: Diagnosis present

## 2018-02-08 DIAGNOSIS — J181 Lobar pneumonia, unspecified organism: Secondary | ICD-10-CM

## 2018-02-08 DIAGNOSIS — J9601 Acute respiratory failure with hypoxia: Secondary | ICD-10-CM | POA: Diagnosis present

## 2018-02-08 LAB — BASIC METABOLIC PANEL
Anion gap: 7 (ref 5–15)
BUN: 13 mg/dL (ref 8–23)
CO2: 29 mmol/L (ref 22–32)
Calcium: 8.6 mg/dL — ABNORMAL LOW (ref 8.9–10.3)
Chloride: 106 mmol/L (ref 98–111)
Creatinine, Ser: 0.66 mg/dL (ref 0.44–1.00)
GFR calc Af Amer: >60 mL/min (ref >60)
GLUCOSE: 122 mg/dL — AB (ref 70–99)
Potassium: 4.4 mmol/L (ref 3.5–5.1)
Sodium: 142 mmol/L (ref 135–145)

## 2018-02-08 LAB — CBC
HEMATOCRIT: 35.9 % — AB (ref 36.0–46.0)
Hemoglobin: 11.8 g/dL — ABNORMAL LOW (ref 12.0–15.0)
MCH: 31.6 pg (ref 26.0–34.0)
MCHC: 32.9 g/dL (ref 30.0–36.0)
MCV: 96 fL (ref 78.0–100.0)
PLATELETS: 229 10*3/uL (ref 150–400)
RBC: 3.74 MIL/uL — AB (ref 3.87–5.11)
RDW: 13 % (ref 11.5–15.5)
WBC: 8.5 10*3/uL (ref 4.0–10.5)

## 2018-02-08 LAB — EXPECTORATED SPUTUM ASSESSMENT W GRAM STAIN, RFLX TO RESP C

## 2018-02-08 LAB — HIV ANTIBODY (ROUTINE TESTING W REFLEX): HIV Screen 4th Generation wRfx: NONREACTIVE

## 2018-02-08 LAB — EXPECTORATED SPUTUM ASSESSMENT W REFEX TO RESP CULTURE

## 2018-02-08 MED ORDER — ACETAMINOPHEN-CODEINE #3 300-30 MG PO TABS
1.0000 | ORAL_TABLET | Freq: Four times a day (QID) | ORAL | Status: DC | PRN
  Administered 2018-02-08 – 2018-02-10 (×3): 1 via ORAL
  Filled 2018-02-08 (×5): qty 1

## 2018-02-08 MED ORDER — LORAZEPAM 0.5 MG PO TABS: 0.5000 mg | ORAL_TABLET | Freq: Two times a day (BID) | ORAL | Status: DC | PRN

## 2018-02-08 MED ORDER — VITAMIN E 180 MG (400 UNIT) PO CAPS
2000.0000 [IU] | ORAL_CAPSULE | Freq: Every day | ORAL | Status: DC
  Administered 2018-02-08: 2000 [IU] via ORAL
  Filled 2018-02-08 (×4): qty 5

## 2018-02-08 MED ORDER — ACETAMINOPHEN-CODEINE #3 300-30 MG PO TABS: 2.0000 | ORAL_TABLET | Freq: Every evening | ORAL | Status: DC | PRN

## 2018-02-08 MED ORDER — PREDNISONE 50 MG PO TABS
50.0000 mg | ORAL_TABLET | Freq: Every day | ORAL | Status: DC
  Administered 2018-02-08 – 2018-02-09 (×2): 50 mg via ORAL
  Filled 2018-02-08 (×2): qty 1

## 2018-02-08 MED ORDER — ENSURE ENLIVE PO LIQD
237.0000 mL | Freq: Two times a day (BID) | ORAL | Status: DC
  Administered 2018-02-08: 237 mL via ORAL

## 2018-02-08 MED ORDER — SODIUM CHLORIDE 0.9 % IV SOLN
500.0000 mg | INTRAVENOUS | Status: DC
  Administered 2018-02-08 – 2018-02-09 (×2): 500 mg via INTRAVENOUS
  Filled 2018-02-08 (×3): qty 500

## 2018-02-08 MED ORDER — SODIUM CHLORIDE 0.9 % IV SOLN
1.0000 g | INTRAVENOUS | Status: DC
  Administered 2018-02-08 – 2018-02-10 (×3): 1 g via INTRAVENOUS
  Filled 2018-02-08 (×4): qty 10

## 2018-02-08 MED ORDER — SODIUM CHLORIDE 0.9 % IV SOLN
INTRAVENOUS | Status: DC
  Administered 2018-02-08 – 2018-02-11 (×3): via INTRAVENOUS

## 2018-02-08 MED ORDER — ENOXAPARIN SODIUM 40 MG/0.4ML ~~LOC~~ SOLN
40.0000 mg | SUBCUTANEOUS | Status: DC
  Filled 2018-02-08 (×3): qty 0.4

## 2018-02-08 MED ORDER — ASPIRIN 325 MG PO TABS
325.0000 mg | ORAL_TABLET | Freq: Every day | ORAL | Status: DC | PRN
  Filled 2018-02-08: qty 1

## 2018-02-08 MED ORDER — IPRATROPIUM-ALBUTEROL 0.5-2.5 (3) MG/3ML IN SOLN
3.0000 mL | Freq: Three times a day (TID) | RESPIRATORY_TRACT | Status: DC
  Administered 2018-02-08 – 2018-02-11 (×10): 3 mL via RESPIRATORY_TRACT
  Filled 2018-02-08 (×9): qty 3

## 2018-02-08 NOTE — Progress Notes (Signed)
Patient admitted by Dr. Julian ReilGardner overnight for concerns of left lower lobe community-acquired pneumonia.  Patient seen and examined by me this morning at bedside.  She states she feels a little better this morning still requiring 2 L nasal cannula.  Vital signs are relatively stable On physical exam she does have quite diminished breath sounds diffusely.  At this time will continue care as planned we have also concerned about underlying possible mild COPD exacerbation therefore we will start the patient on oral prednisone for 5 days.  Please call with further questions as deemed necessary.  Stephania FragminAnkit Mark Hassey MD 7am-7pm 02/08/18 Paging information on [email or web address]

## 2018-02-08 NOTE — Progress Notes (Signed)
Initial Nutrition Assessment  DOCUMENTATION CODES:   Non-severe (moderate) malnutrition in context of chronic illness  INTERVENTION:   - Magic cup TID with meals, each supplement provides 290 kcal and 9 grams of protein  - Encourage adequate PO intake  NUTRITION DIAGNOSIS:   Moderate Malnutrition related to chronic illness (COPD) as evidenced by mild fat depletion, moderate fat depletion, mild muscle depletion, moderate muscle depletion, severe muscle depletion.  GOAL:   Patient will meet greater than or equal to 90% of their needs  MONITOR:   PO intake, Weight trends, Supplement acceptance, Skin  REASON FOR ASSESSMENT:   Malnutrition Screening Tool    ASSESSMENT:   83 year old female who presented to the ED with SOB. PMH significant for arthritis. Pt denies history of COPD; however, diagnosis is in chart. Pt admitted for CAP.  Spoke with pt and her daughter at bedside. Pt states that her appetite is normally good and that she normally eats well. Pt endorses a decreased appetite due to "being stopped up." Pt shares she does not eat "fatty meat" but does like sausage and bacon.  When asked how many meals pt typically consumes in a day, pt replied "ten." Pt's daughter clarified that pt typically eats 3 regular meals and several snacks throughout the day. For example, breakfast may include cereal, 2 pieces of toast, and coffee. A snack may include a peanut butter and jelly sandwich, peanut butter crackers, or fruit.  Pt endorses progressive weight loss, stating that her UBW is 130-140 lbs. Pt states that her weight loss started 3 years ago when her husband was sick. Weight history in chart is limited. Last recorded weight PTA was in 2013.  Pt's daughter states that they have Ensure at home but pt refuses to drink it. Pt is willing to try Magic Cup.  Medications reviewed and include: Ensure Enlive BID, 2000 units vitamin E daily  Labs reviewed.  NUTRITION - FOCUSED PHYSICAL  EXAM:    Most Recent Value  Orbital Region  Mild depletion  Upper Arm Region  Moderate depletion  Thoracic and Lumbar Region  Moderate depletion  Buccal Region  Mild depletion  Temple Region  Moderate depletion  Clavicle Bone Region  Severe depletion  Clavicle and Acromion Bone Region  Severe depletion  Scapular Bone Region  Unable to assess  Dorsal Hand  Moderate depletion  Patellar Region  Moderate depletion  Anterior Thigh Region  Moderate depletion  Posterior Calf Region  Moderate depletion  Edema (RD Assessment)  None  Hair  Reviewed  Eyes  Reviewed  Mouth  Reviewed  Skin  Reviewed  Nails  Reviewed       Diet Order:   Diet Order            Diet regular Room service appropriate? Yes; Fluid consistency: Thin  Diet effective now              EDUCATION NEEDS:   No education needs have been identified at this time  Skin:  Skin Assessment: Skin Integrity Issues: Stage II to sacrum  Last BM:  02/07/18  Height:   Ht Readings from Last 1 Encounters:  02/08/18 5\' 2"  (1.575 m)    Weight:   Wt Readings from Last 1 Encounters:  02/08/18 48.6 kg    Ideal Body Weight:  50 kg  BMI:  Body mass index is 19.6 kg/m.  Estimated Nutritional Needs:   Kcal:  1500-1700  Protein:  70-85 grams  Fluid:  1.5-1.7 L    Jae DireKate  Darrold Span, MS, RD, LDN Pager: [phone] Weekend/After Hours: [phone]

## 2018-02-08 NOTE — H&P (Signed)
History and Physical    Jenna Carson WUJ:[id number] DOB: [birth year] DOA: 02/07/2018  PCP: Roland Rack, MD  Patient coming from: Home  I have personally briefly reviewed patient's old medical records in [facility]  Chief Complaint: SOB  HPI: Jenna Carson is a 83 y.o. female with medical history significant of arthritis (denies h/o COPD).  Patient presents to the ED with c/o 4 day h/o cough, productive of greenish sputum.  Symptoms persistent.  Associated SOB.  Nothing makes better or worse.  Symptoms worsening over past 4 days.   ED Course: Initial O2 sat 70%, improved with O2, neb treatments.  Noted to be febrile to 100.4.  Tachycardic to 140 initially, improved with IVF.  CXR confirms LLL PNA.  Patient started on rocephin / azithro.  Transferred to [facility] for admission.   Review of Systems: As per HPI otherwise 10 point review of systems negative.   Past Medical History:  Diagnosis Date  . Arthritis   . COPD (chronic obstructive pulmonary disease) (HCC)     Past Surgical History:  Procedure Laterality Date  . EYE SURGERY       reports that she has been smoking cigarettes. She has a 30.00 pack-year smoking history. She has quit using smokeless tobacco.  Her smokeless tobacco use included snuff. She reports that she drinks about 7.0 standard drinks of alcohol per week. She reports that she does not use drugs.  No Known Allergies  History reviewed. No pertinent family history.   Prior to Admission medications   Medication Sig Start Date End Date Taking? Authorizing Provider  acetaminophen-codeine (TYLENOL #3) 300-30 MG per tablet Take 2 tablets by mouth at bedtime as needed for moderate pain.    [provider]  albuterol (PROVENTIL HFA;VENTOLIN HFA) 108 (90 BASE) MCG/ACT inhaler Inhale 2 puffs into the lungs every 6 (six) hours as needed. Wheezing and shortness of breath    [provider]  aspirin 325 MG tablet Take 325 mg by mouth daily as needed for  headache.     [provider]  HYDROcodone-acetaminophen (NORCO/VICODIN) 5-325 MG per tablet Take 1 tablet by mouth every 6 (six) hours as needed. 07/22/14   Ward, Layla Maw, DO  LORazepam (ATIVAN) 0.5 MG tablet Take 0.5 mg by mouth 2 (two) times daily as needed for anxiety.    [provider]  vitamin E 1000 UNIT capsule Take 2,000 Units by mouth daily.    [provider]    Physical Exam: Vitals:   02/07/18 2200 02/07/18 2214 02/07/18 2300 02/07/18 2353  BP:  117/66 132/67 128/60  Pulse: (!) 101 (!) 104 (!) 102 (!) 39  Resp: 20 20 (!) 22 20  Temp:    98.4 F (36.9 C)  TempSrc:      SpO2: 98% 96% 97% 93%  Weight:        Constitutional: NAD, calm, comfortable Eyes: PERRL, lids and conjunctivae normal ENMT: Mucous membranes are moist. Posterior pharynx clear of any exudate or lesions.Normal dentition.  Neck: normal, supple, no masses, no thyromegaly Respiratory: LLL rhonchi, diminished breath sounds to LLL as well, productive sounding cough Cardiovascular: Regular rate and rhythm, no murmurs / rubs / gallops. No extremity edema. 2+ pedal pulses. No carotid bruits.  Abdomen: no tenderness, no masses palpated. No hepatosplenomegaly. Bowel sounds positive.  Musculoskeletal: no clubbing / cyanosis. No joint deformity upper and lower extremities. Good ROM, no contractures. Normal muscle tone.  Skin: no rashes, lesions, ulcers. No induration Neurologic: CN 2-12 grossly  intact. Sensation intact, DTR normal. Strength 5/5 in all 4.  Psychiatric: Normal judgment and insight. Alert and oriented x 3. Normal mood.    Labs on Admission: I have personally reviewed following labs and imaging studies  CBC: Recent Labs  Lab 02/07/18 1657  WBC 11.5*  NEUTROABS 9.3*  HGB 13.4  HCT 38.3  MCV 92.7  PLT 257   Basic Metabolic Panel: Recent Labs  Lab 02/07/18 1657  NA 138  K 3.5  CL 102  CO2 25  GLUCOSE 176*  BUN 18  CREATININE 0.74  CALCIUM 8.7*   GFR: CrCl  cannot be calculated (Unknown ideal weight.). Liver Function Tests: Recent Labs  Lab 02/07/18 1657  AST 24  ALT 15  ALKPHOS 77  BILITOT 0.9  PROT 7.2  ALBUMIN 3.4*   No results for input(s): LIPASE, AMYLASE in the last 168 hours. No results for input(s): AMMONIA in the last 168 hours. Coagulation Profile: Recent Labs  Lab 02/07/18 1657  INR 1.06   Cardiac Enzymes: No results for input(s): CKTOTAL, CKMB, CKMBINDEX, TROPONINI in the last 168 hours. BNP (last 3 results) No results for input(s): PROBNP in the last 8760 hours. HbA1C: No results for input(s): HGBA1C in the last 72 hours. CBG: No results for input(s): GLUCAP in the last 168 hours. Lipid Profile: No results for input(s): CHOL, HDL, LDLCALC, TRIG, CHOLHDL, LDLDIRECT in the last 72 hours. Thyroid Function Tests: No results for input(s): TSH, T4TOTAL, FREET4, T3FREE, THYROIDAB in the last 72 hours. Anemia Panel: No results for input(s): VITAMINB12, FOLATE, FERRITIN, TIBC, IRON, RETICCTPCT in the last 72 hours. Urine analysis:    Component Value Date/Time   COLORURINE YELLOW 02/07/2018 1813   APPEARANCEUR CLOUDY (A) 02/07/2018 1813   LABSPEC >1.030 (H) 02/07/2018 1813   PHURINE 6.0 02/07/2018 1813   GLUCOSEU NEGATIVE 02/07/2018 1813   HGBUR SMALL (A) 02/07/2018 1813   BILIRUBINUR SMALL (A) 02/07/2018 1813   KETONESUR 15 (A) 02/07/2018 1813   PROTEINUR 100 (A) 02/07/2018 1813   UROBILINOGEN 1.0 08/22/2011 2124   NITRITE NEGATIVE 02/07/2018 1813   LEUKOCYTESUR SMALL (A) 02/07/2018 1813    Radiological Exams on Admission: Dg Chest Portable 1 View  Result Date: 02/07/2018 CLINICAL DATA:  83 y/o  F; 4 days of shortness of breath. EXAM: PORTABLE CHEST 1 VIEW COMPARISON:  08/22/2011 chest radiograph. FINDINGS: Normal cardiac silhouette. Aortic atherosclerosis with calcification. Left lower lung zone streaky consolidation probably representing pneumonia. Stable hyperinflation of lungs and emphysema. No pleural  effusion or pneumothorax. No acute osseous abnormality is evident. IMPRESSION: COPD. Left lower lung zone consolidation likely representing pneumonia. Followup PA and lateral chest X-ray is recommended in 3-4 weeks following trial of antibiotic therapy to ensure resolution and exclude underlying malignancy. Electronically Signed   By: Mitzi HansenLance  Furusawa-Stratton M.D.   On: 02/07/2018 17:39    EKG: Independently reviewed.  Assessment/Plan Principal Problem:   Community acquired pneumonia of left lower lobe of lung (HCC) Active Problems:   Acute respiratory failure with hypoxia (HCC)   Tachycardia    1. LLL CAP - causing acute resp failure with new O2 requirement 1. PNA pathway 2. Rocephin / azithromycin 3. Cultures pending 4. O2 via [place] 5. Adult wheeze protocol for breathing treatments 2. Tachycardia, fever - 1. Tylenol PRN fever 2. Tele monitor 3. IVF: 1.5L bolus in ED and 50 cc/hr NS for now 4. Repeat CBC / BMP in AM  DVT prophylaxis: Lovenox Code Status: Full Family Communication: Daughter at bedside Disposition Plan: Home after  admit Consults called: None Admission status: Admit to inpatient - IP status for new O2 requirement, SIRS / sepsis.   Hillary BowGARDNER, Kaydan Wilhoite M. DO [facility] Pager [phone] Only works nights!  If 7AM-7PM, please contact the primary day team physician taking care of patient  [email or web address] Password [email or web address]  02/08/2018, 12:38 AM

## 2018-02-08 NOTE — Consult Note (Addendum)
WOC Nurse wound consult note Reason for Consult: Consult requested for buttocks/sacrum Wound type: Sacrum/bilat upper buttocks with stage 2 pressure injury/combination of moisture, pressure and shear to the affected area. Pt is very emeciated and states she spends most of her time on a sofa, and has to scoot to the edge to get up. Bilat elbows with dry slightly raised callous areas, pt states these have cracked before and evolved into full thickness wounds, related to using her elbows to get to a standing position.  Currently, there are no open wounds or drainage to bilat elbows. Pressure Injury POA: Yes Measurement: 3X3X.1cm patchy areas of partial thickness skin loss to sacrum/buttocks. Wound bed: Pink and moist, small amt yellow drainage, no odor. Dressing procedure/placement/frequency: Foam dressing has been applied to protect and promote healing to sacrum/buttocks. Discussed plan of care with patient and she verbalized understanding. Please re-consult if further assistance is needed.  Thank-you,  Cammie Mcgeeawn Frantz Quattrone MSN, RN, CWOCN, WadleyWCN-AP, CNS [phone]

## 2018-02-08 NOTE — Progress Notes (Signed)
Pt [facility] from [facility] for [facility] and antibiotic therapy. Pt maintaining O2 saturations 91-99 on 2LNC, with dyspnea on exertion. Stage 2 Sacral wound noted, skin assessment completed with Riley KillJenny T, RN. Pt oriented to room, call bell, unit routines, and fall prevention policy with bed alarm in place. Pt resting comfortably in bed with daughter at bedside. Will continue to monitor.

## 2018-02-08 NOTE — Progress Notes (Signed)
Chaplain came to share Advance Directives with patient.  She said she did not need them or want the materials. IT sounded as if she may already have something in place but not here.  Phebe CollaDonna S Sheneka Schrom, Chaplain   02/08/18 1100  Clinical Encounter Type  Visited With Patient  Visit Type Initial;Other (Comment) (to share advance directives)  Referral From Physician  Consult/Referral To Chaplain

## 2018-02-09 DIAGNOSIS — L8990 Pressure ulcer of unspecified site, unspecified stage: Secondary | ICD-10-CM

## 2018-02-09 DIAGNOSIS — E440 Moderate protein-calorie malnutrition: Secondary | ICD-10-CM

## 2018-02-09 LAB — URINE CULTURE

## 2018-02-09 MED ORDER — METHYLPREDNISOLONE SODIUM SUCC 40 MG IJ SOLR
40.0000 mg | Freq: Three times a day (TID) | INTRAMUSCULAR | Status: DC
  Administered 2018-02-09 – 2018-02-11 (×6): 40 mg via INTRAVENOUS
  Filled 2018-02-09 (×6): qty 1

## 2018-02-09 NOTE — Progress Notes (Signed)
PROGRESS NOTE    Jenna Carson  WUJ:[id number] DOB: Sep 02, 1934 DOA: 02/07/2018 PCP: Roland Rack, MD   Brief Narrative:  83 yo with pmhx of OA, possible underlying COPD (not officially dx), Previous tobacco use came to the hospital with complaints of SOB. She was found to be in COPD exac and CXR was Suggestive of LLL PNA. She was started on routine COPD Tx along with Antibiotics.    Assessment & Plan:   Principal Problem:   Community acquired pneumonia of left lower lobe of lung (HCC) Active Problems:   Acute respiratory failure with hypoxia (HCC)   Tachycardia   Malnutrition of moderate degree   Pressure injury of skin  Acute Respiratory Distress with hypoxia, 85%on RA Acute COPD exac, Moderate  Left Lower Lobe Community Acquired PNA -cont the patient on Schedule and prn bronchodilators.  -Cont Rocephin and Azithromycin D2 -Pulmicort BID -advised to have outpatient PFTs once she is over with this acute flare.  -IS and flutter valve   DVT prophylaxis: Lovenox  Code Status: Full Code  Family Communication:  Daughter at bedside  Disposition Plan: Maintain in patient stay for one more day  Consultants:   None   Procedures:   None   Antimicrobials:   Rocephin 8/21  Azithromycin 8/21   Subjective: Still has hypoxia and sob with minimal exertion. Able to cough a little more today and bring up mucus.   Review of Systems Otherwise negative except as per HPI, including: General: Denies fever, chills, night sweats or unintended weight loss. Resp: Denies cough, wheezing, shortness of breath. Cardiac: Denies chest pain, palpitations, orthopnea, paroxysmal nocturnal dyspnea. GI: Denies abdominal pain, nausea, vomiting, diarrhea or constipation GU: Denies dysuria, frequency, hesitancy or incontinence MS: Denies muscle aches, joint pain or swelling Neuro: Denies headache, neurologic deficits (focal weakness, numbness, tingling), abnormal gait Psych: Denies anxiety,  depression, SI/HI/AVH Skin: Denies new rashes or lesions ID: Denies sick contacts, exotic exposures, travel  Objective: Vitals:   02/08/18 2115 02/08/18 2123 02/09/18 0449 02/09/18 0840  BP: 119/62  118/64   Pulse: 78  71   Resp: 18  16   Temp: 98.8 F (37.1 C)  97.7 F (36.5 C)   TempSrc:      SpO2: 93% 93% 95% 94%  Weight:      Height:        Intake/Output Summary (Last 24 hours) at 02/09/2018 1141 Last data filed at 02/09/2018 0030 Gross per 24 hour  Intake 1340.54 ml  Output 600 ml  Net 740.54 ml   Filed Weights   02/07/18 1651 02/08/18 0108  Weight: 46.3 kg 48.6 kg    Examination:  General exam: Appears calm and comfortable; on 3L [place]  Respiratory system: diffuse diminished BS Cardiovascular system: S1 & S2 heard, RRR. No JVD, murmurs, rubs, gallops or clicks. No pedal edema. Gastrointestinal system: Abdomen is nondistended, soft and nontender. No organomegaly or masses felt. Normal bowel sounds heard. Central nervous system: Alert and oriented. No focal neurological deficits. Extremities: Symmetric 5 x 5 power. Skin: No rashes, lesions or ulcers Psychiatry: Judgement and insight appear normal. Mood & affect appropriate.     Data Reviewed:   CBC: Recent Labs  Lab 02/07/18 1657 02/08/18 0057  WBC 11.5* 8.5  NEUTROABS 9.3*  --   HGB 13.4 11.8*  HCT 38.3 35.9*  MCV 92.7 96.0  PLT 257 229   Basic Metabolic Panel: Recent Labs  Lab 02/07/18 1657 02/08/18 0057  NA 138 142  K 3.5 4.4  CL 102 106  CO2 25 29  GLUCOSE 176* 122*  BUN 18 13  CREATININE 0.74 0.66  CALCIUM 8.7* 8.6*   GFR: Estimated Creatinine Clearance: 43 mL/min (by C-G formula based on SCr of 0.66 mg/dL). Liver Function Tests: Recent Labs  Lab 02/07/18 1657  AST 24  ALT 15  ALKPHOS 77  BILITOT 0.9  PROT 7.2  ALBUMIN 3.4*   No results for input(s): LIPASE, AMYLASE in the last 168 hours. No results for input(s): AMMONIA in the last 168 hours. Coagulation Profile: Recent Labs   Lab 02/07/18 1657  INR 1.06   Cardiac Enzymes: No results for input(s): CKTOTAL, CKMB, CKMBINDEX, TROPONINI in the last 168 hours. BNP (last 3 results) No results for input(s): PROBNP in the last 8760 hours. HbA1C: No results for input(s): HGBA1C in the last 72 hours. CBG: No results for input(s): GLUCAP in the last 168 hours. Lipid Profile: No results for input(s): CHOL, HDL, LDLCALC, TRIG, CHOLHDL, LDLDIRECT in the last 72 hours. Thyroid Function Tests: No results for input(s): TSH, T4TOTAL, FREET4, T3FREE, THYROIDAB in the last 72 hours. Anemia Panel: No results for input(s): VITAMINB12, FOLATE, FERRITIN, TIBC, IRON, RETICCTPCT in the last 72 hours. Sepsis Labs: Recent Labs  Lab 02/07/18 1701  LATICACIDVEN 1.62    Recent Results (from the past 240 hour(s))  Culture, blood (Routine x 2)     Status: None (Preliminary result)   Collection Time: 02/07/18  4:55 PM  Result Value Ref Range Status   Specimen Description   Final    BLOOD BLOOD RIGHT FOREARM Performed at [facility], [address] [place], [place] [address]    Special Requests   Final    BOTTLES DRAWN AEROBIC AND ANAEROBIC Blood Culture adequate volume Performed at [facility], [address] [place], [place] [address]    Culture   Final    NO GROWTH 2 DAYS Performed at [facility], [address] [place], [place] [address]    Report Status PENDING  Incomplete  Culture, blood (Routine x 2)     Status: None (Preliminary result)   Collection Time: 02/07/18  5:00 PM  Result Value Ref Range Status   Specimen Description   Final    BLOOD BLOOD LEFT FOREARM Performed at [facility], [address] [place], [place] [address]    Special Requests   Final    BOTTLES DRAWN AEROBIC AND ANAEROBIC Blood Culture adequate volume Performed at [facility], [address] [place], [place] [address]    Culture   Final    NO GROWTH 2 DAYS Performed at [facility], [address], [place], [place] [address]    Report Status PENDING  Incomplete  Urine culture     Status: Abnormal   Collection Time: 02/07/18  6:13 PM  Result Value Ref Range Status   Specimen Description   Final    URINE, RANDOM Performed at [facility], [address] [place], [place] [address]    Special Requests   Final    NONE Performed at [facility], [address] [place], [place] [address]    Culture MULTIPLE SPECIES PRESENT, SUGGEST RECOLLECTION (A)  Final   Report Status 02/09/2018 FINAL  Final  Culture, sputum-assessment     Status: None   Collection Time: 02/08/18  5:12 PM  Result Value Ref Range Status   Specimen Description  EXPECTORATED SPUTUM  Final   Special Requests NONE  Final   Sputum evaluation   Final    THIS SPECIMEN IS ACCEPTABLE FOR SPUTUM CULTURE Performed at [facility], [address] [place], [place] [address]    Report Status 02/08/2018 FINAL  Final  Culture, respiratory     Status: None (Preliminary result)   Collection Time: 02/08/18  5:12 PM  Result Value Ref Range Status   Specimen Description EXPECTORATED SPUTUM  Final   Special Requests NONE Reflexed from [id number]  Final   Gram Stain   Final    MODERATE WBC PRESENT, PREDOMINANTLY PMN MODERATE GRAM POSITIVE COCCI IN CLUSTERS FEW GRAM NEGATIVE RODS FEW GRAM POSITIVE RODS Performed at [facility], [address] [place], [place] [address]    Culture PENDING  Incomplete   Report Status PENDING  Incomplete         Radiology Studies: Dg Chest Portable 1 View  Result Date: 02/07/2018 CLINICAL DATA:  83 y/o  F; 4 days of shortness of breath. EXAM: PORTABLE CHEST 1 VIEW COMPARISON:  08/22/2011 chest radiograph. FINDINGS: Normal cardiac silhouette. Aortic atherosclerosis with calcification. Left lower lung zone streaky consolidation probably representing pneumonia. Stable hyperinflation of lungs and emphysema. No pleural effusion or  pneumothorax. No acute osseous abnormality is evident. IMPRESSION: COPD. Left lower lung zone consolidation likely representing pneumonia. Followup PA and lateral chest X-ray is recommended in 3-4 weeks following trial of antibiotic therapy to ensure resolution and exclude underlying malignancy. Electronically Signed   By: Mitzi HansenLance  Furusawa-Stratton M.D.   On: 02/07/2018 17:39        Scheduled Meds: . enoxaparin (LOVENOX) injection  40 mg Subcutaneous Q24H  . ipratropium-albuterol  3 mL Nebulization TID  . methylPREDNISolone (SOLU-MEDROL) injection  40 mg Intravenous Q8H  . vitamin E  2,000 Units Oral Daily   Continuous Infusions: . sodium chloride Stopped (02/08/18 1819)  . azithromycin 250 mL/hr at 02/08/18 1853  . cefTRIAXone (ROCEPHIN)  IV Stopped (02/08/18 1740)     LOS: 2 days    I have spent 25 minutes face to face with the patient and on the ward discussing the patients care, assessment, plan and disposition with other care givers. >50% of the time was devoted counseling the patient about the risks and benefits of treatment and coordinating care.     Aundrea Horace Joline Maxcyhirag Kyarah Enamorado, MD [facility] Pager [phone]   If 7PM-7AM, please contact night-coverage [email or web address] Password [email or web address] 02/09/2018, 11:41 AM

## 2018-02-09 NOTE — Progress Notes (Signed)
SATURATION QUALIFICATIONS: (This note is used to comply with regulatory documentation for home oxygen)  Patient Saturations on Room Air at Rest = 88%  Patient Saturations on Room Air while Ambulating = 88%  Patient Saturations on 3 Liters of oxygen while Ambulating = 91%  Please briefly explain why patient needs home oxygen:

## 2018-02-10 LAB — COMPREHENSIVE METABOLIC PANEL
ALT: 23 U/L (ref 0–44)
ANION GAP: 5 (ref 5–15)
AST: 29 U/L (ref 15–41)
Albumin: 2.8 g/dL — ABNORMAL LOW (ref 3.5–5.0)
Alkaline Phosphatase: 71 U/L (ref 38–126)
BILIRUBIN TOTAL: 0.4 mg/dL (ref 0.3–1.2)
BUN: 10 mg/dL (ref 8–23)
CALCIUM: 8.9 mg/dL (ref 8.9–10.3)
CO2: 28 mmol/L (ref 22–32)
Chloride: 108 mmol/L (ref 98–111)
Creatinine, Ser: 0.55 mg/dL (ref 0.44–1.00)
Glucose, Bld: 148 mg/dL — ABNORMAL HIGH (ref 70–99)
Potassium: 4 mmol/L (ref 3.5–5.1)
Sodium: 141 mmol/L (ref 135–145)
Total Protein: 6 g/dL — ABNORMAL LOW (ref 6.5–8.1)

## 2018-02-10 LAB — CBC
HCT: 35.6 % — ABNORMAL LOW (ref 36.0–46.0)
Hemoglobin: 11.6 g/dL — ABNORMAL LOW (ref 12.0–15.0)
MCH: 30.7 pg (ref 26.0–34.0)
MCHC: 32.6 g/dL (ref 30.0–36.0)
MCV: 94.2 fL (ref 78.0–100.0)
Platelets: 263 10*3/uL (ref 150–400)
RBC: 3.78 MIL/uL — ABNORMAL LOW (ref 3.87–5.11)
RDW: 12.9 % (ref 11.5–15.5)
WBC: 10.1 10*3/uL (ref 4.0–10.5)

## 2018-02-10 LAB — MAGNESIUM: Magnesium: 2 mg/dL (ref 1.7–2.4)

## 2018-02-10 LAB — BRAIN NATRIURETIC PEPTIDE: B NATRIURETIC PEPTIDE 5: 414.2 pg/mL — AB (ref 0.0–100.0)

## 2018-02-10 MED ORDER — AZITHROMYCIN 500 MG PO TABS
500.0000 mg | ORAL_TABLET | ORAL | Status: DC
  Administered 2018-02-10: 500 mg via ORAL
  Filled 2018-02-10: qty 1

## 2018-02-10 MED ORDER — FLUCONAZOLE 150 MG PO TABS
150.0000 mg | ORAL_TABLET | Freq: Once | ORAL | Status: AC
  Administered 2018-02-10: 150 mg via ORAL
  Filled 2018-02-10: qty 1

## 2018-02-10 MED ORDER — ALBUTEROL SULFATE (2.5 MG/3ML) 0.083% IN NEBU
2.5000 mg | INHALATION_SOLUTION | Freq: Four times a day (QID) | RESPIRATORY_TRACT | Status: DC | PRN
  Administered 2018-02-10: 2.5 mg via RESPIRATORY_TRACT
  Filled 2018-02-10: qty 3

## 2018-02-10 MED ORDER — FUROSEMIDE 10 MG/ML IJ SOLN
40.0000 mg | Freq: Once | INTRAMUSCULAR | Status: AC
  Administered 2018-02-10: 40 mg via INTRAVENOUS
  Filled 2018-02-10: qty 4

## 2018-02-10 NOTE — Progress Notes (Signed)
Pt agitated and anxious but refused PRN med. Pt stated that HCP told her not to take it. Explained to pt that she can take it if need due to the situation and condition, patient still refused. Pt sat in the 70 and was SOB, notified resp. Pt agree to breathing tx. Pt refused her 6 am Lovenox . Pt is stable and resting with daughter at bed side. Will continue to monitor for any change.

## 2018-02-10 NOTE — Progress Notes (Signed)
RT called to patients room for assessment of patients WOB.  Patient satting 88% on 2L with increased WOB.  Patient recently got out of bed to use the bathroom and became SOB and anxious. BS clear and diminished with slight exp. wheeze. RT administered albuterol tx and increased [place] to 4L.[place]. Patient satting 91%.

## 2018-02-10 NOTE — Progress Notes (Signed)
PROGRESS NOTE    Jenna Carson  ZOX:[id number] DOB: [birth year] DOA: 02/07/2018 PCP: Roland Rack, MD   Brief Narrative:  83 yo with pmhx of OA, possible underlying COPD (not officially dx), Previous tobacco use came to the hospital with complaints of SOB. She was found to be in COPD exac and CXR was Suggestive of LLL PNA. She was started on routine COPD Tx along with Antibiotics.    Assessment & Plan:   Principal Problem:   Community acquired pneumonia of left lower lobe of lung (HCC) Active Problems:   Acute respiratory failure with hypoxia (HCC)   Tachycardia   Malnutrition of moderate degree   Pressure injury of skin  Acute Respiratory Distress with hypoxia, 85%on RA. 4L Acute COPD exac, Moderate  Left Lower Lobe Community Acquired PNA -cont the patient on Schedule and prn bronchodilators.  -Cont Rocephin and Azithromycin D3. Azithromycin will be changed to PO -Pulmicort BID. On IV solumedrol  -advised to have outpatient PFTs once she is over with this acute flare.  -IS and flutter valve -BNP slightly elevated, will give one dose of Lasix 40mg  IV  Vaginal Itching -patient states its little concerning for possible candida. Ordered one dose of Diflucan.    DVT prophylaxis: Lovenox  Code Status: Full Code  Family Communication:  Daughter at bedside  Disposition Plan: [facility] hospital stay until breathing is better.   Consultants:   None   Procedures:   None   Antimicrobials:   Rocephin 8/21  Azithromycin 8/21   Subjective: Still has quite a bit of exertional dyspnea even walking to the bathroom. Oxygen is up to 4L.  Reporting of some whitish vaginal discharge concerns for yeast.   Review of Systems Otherwise negative except as per HPI, including: General = no fevers, chills, dizziness, malaise, fatigue HEENT/EYES = negative for pain, redness, loss of vision, double vision, blurred vision, loss of hearing, sore throat, hoarseness,  dysphagia Cardiovascular= negative for chest pain, palpitation, murmurs, lower extremity swelling Respiratory/lungs= negative for shortness of breath, cough, hemoptysis, wheezing, mucus production Gastrointestinal= negative for nausea, vomiting,, abdominal pain, melena, hematemesis Genitourinary= negative for Dysuria, Hematuria, Change in Urinary Frequency MSK = Negative for arthralgia, myalgias, Back Pain, Joint swelling  Neurology= Negative for headache, seizures, numbness, tingling  Psychiatry= Negative for anxiety, depression, suicidal and homocidal ideation Allergy/Immunology= Medication/Food allergy as listed  Skin= Negative for Rash, lesions, ulcers, itching   Objective: Vitals:   02/10/18 0332 02/10/18 0335 02/10/18 0531 02/10/18 0826  BP: 137/88  122/66   Pulse: 89 90 81   Resp:  (!) 26 18   Temp: 98.5 F (36.9 C)  98.8 F (37.1 C)   TempSrc: Oral  Oral   SpO2: 93% 92% 99% 93%  Weight:      Height:        Intake/Output Summary (Last 24 hours) at 02/10/2018 1139 Last data filed at 02/10/2018 0900 Gross per 24 hour  Intake 240 ml  Output -  Net 240 ml   Filed Weights   02/07/18 1651 02/08/18 0108  Weight: 46.3 kg 48.6 kg    Examination:  Constitutional: NAD, calm, comfortable; on 4L [place] Eyes: PERRL, lids and conjunctivae normal ENMT: Mucous membranes are moist. Posterior pharynx clear of any exudate or lesions.Normal dentition.  Neck: normal, supple, no masses, no thyromegaly Respiratory: diffuse coarse BS today Cardiovascular: Regular rate and rhythm, no murmurs / rubs / gallops. No extremity edema. 2+ pedal pulses. No carotid bruits.  Abdomen: no tenderness, no masses palpated. No  hepatosplenomegaly. Bowel sounds positive.  Musculoskeletal: no clubbing / cyanosis. No joint deformity upper and lower extremities. Good ROM, no contractures. Normal muscle tone.  Skin: no rashes, lesions, ulcers. No induration Neurologic: CN 2-12 grossly intact. Sensation intact,  DTR normal. Strength 4/5 in all 4.  Psychiatric: Normal judgment and insight. Alert and oriented x 3. Normal mood.      Data Reviewed:   CBC: Recent Labs  Lab 02/07/18 1657 02/08/18 0057 02/10/18 0757  WBC 11.5* 8.5 10.1  NEUTROABS 9.3*  --   --   HGB 13.4 11.8* 11.6*  HCT 38.3 35.9* 35.6*  MCV 92.7 96.0 94.2  PLT 257 229 263   Basic Metabolic Panel: Recent Labs  Lab 02/07/18 1657 02/08/18 0057 02/10/18 0757  NA 138 142 141  K 3.5 4.4 4.0  CL 102 106 108  CO2 25 29 28   GLUCOSE 176* 122* 148*  BUN 18 13 10   CREATININE 0.74 0.66 0.55  CALCIUM 8.7* 8.6* 8.9  MG  --   --  2.0   GFR: Estimated Creatinine Clearance: 43 mL/min (by C-G formula based on SCr of 0.55 mg/dL). Liver Function Tests: Recent Labs  Lab 02/07/18 1657 02/10/18 0757  AST 24 29  ALT 15 23  ALKPHOS 77 71  BILITOT 0.9 0.4  PROT 7.2 6.0*  ALBUMIN 3.4* 2.8*   No results for input(s): LIPASE, AMYLASE in the last 168 hours. No results for input(s): AMMONIA in the last 168 hours. Coagulation Profile: Recent Labs  Lab 02/07/18 1657  INR 1.06   Cardiac Enzymes: No results for input(s): CKTOTAL, CKMB, CKMBINDEX, TROPONINI in the last 168 hours. BNP (last 3 results) No results for input(s): PROBNP in the last 8760 hours. HbA1C: No results for input(s): HGBA1C in the last 72 hours. CBG: No results for input(s): GLUCAP in the last 168 hours. Lipid Profile: No results for input(s): CHOL, HDL, LDLCALC, TRIG, CHOLHDL, LDLDIRECT in the last 72 hours. Thyroid Function Tests: No results for input(s): TSH, T4TOTAL, FREET4, T3FREE, THYROIDAB in the last 72 hours. Anemia Panel: No results for input(s): VITAMINB12, FOLATE, FERRITIN, TIBC, IRON, RETICCTPCT in the last 72 hours. Sepsis Labs: Recent Labs  Lab 02/07/18 1701  LATICACIDVEN 1.62    Recent Results (from the past 240 hour(s))  Culture, blood (Routine x 2)     Status: None (Preliminary result)   Collection Time: 02/07/18  4:55 PM  Result  Value Ref Range Status   Specimen Description   Final    BLOOD BLOOD RIGHT FOREARM Performed at [facility] [place], [address] [place], [place] [address]    Special Requests   Final    BOTTLES DRAWN AEROBIC AND ANAEROBIC Blood Culture adequate volume Performed at [facility] [place], [address] [place], [place] [address]    Culture   Final    NO GROWTH 3 DAYS Performed at [facility], [address] [place], [place] [address]    Report Status PENDING  Incomplete  Culture, blood (Routine x 2)     Status: None (Preliminary result)   Collection Time: 02/07/18  5:00 PM  Result Value Ref Range Status   Specimen Description   Final    BLOOD BLOOD LEFT FOREARM Performed at [facility] [place], [address] [place], [place] [address]    Special Requests   Final    BOTTLES DRAWN AEROBIC AND ANAEROBIC Blood Culture adequate volume Performed at [facility] [place], [address] [place], [place] [address]    Culture   Final    NO GROWTH 3 DAYS Performed at [facility], [address] [place], [place] [address]    Report Status PENDING  Incomplete  Urine culture     Status: Abnormal   Collection Time: 02/07/18  6:13 PM  Result Value Ref Range Status   Specimen Description   Final    URINE, RANDOM Performed at [facility], [address] [place], [place] [address]    Special Requests   Final    NONE Performed at [facility], [address] [place], [place] [address]    Culture MULTIPLE SPECIES PRESENT, SUGGEST RECOLLECTION (A)  Final   Report Status 02/09/2018 FINAL  Final  Culture, sputum-assessment     Status: None   Collection Time: 02/08/18  5:12 PM  Result Value Ref Range Status   Specimen Description EXPECTORATED SPUTUM  Final   Special Requests NONE  Final   Sputum evaluation   Final    THIS SPECIMEN IS ACCEPTABLE FOR SPUTUM CULTURE Performed at [facility], [address], [place],  [place] [address]    Report Status 02/08/2018 FINAL  Final  Culture, respiratory     Status: None (Preliminary result)   Collection Time: 02/08/18  5:12 PM  Result Value Ref Range Status   Specimen Description EXPECTORATED SPUTUM  Final   Special Requests NONE Reflexed from [id number]  Final   Gram Stain   Final    MODERATE WBC PRESENT, PREDOMINANTLY PMN MODERATE GRAM POSITIVE COCCI IN CLUSTERS FEW GRAM NEGATIVE RODS FEW GRAM POSITIVE RODS    Culture   Final    FEW Consistent with normal respiratory flora. Performed at [facility], [address], [place], [place] [address]    Report Status PENDING  Incomplete         Radiology Studies: No results found.      Scheduled Meds: . azithromycin  500 mg Oral Q24H  . enoxaparin (LOVENOX) injection  40 mg Subcutaneous Q24H  . fluconazole  150 mg Oral Once  . ipratropium-albuterol  3 mL Nebulization TID  . methylPREDNISolone (SOLU-MEDROL) injection  40 mg Intravenous Q8H  . vitamin E  2,000 Units Oral Daily   Continuous Infusions: . sodium chloride 50 mL/hr at 02/10/18 0423  . cefTRIAXone (ROCEPHIN)  IV 1 g (02/09/18 1726)     LOS: 3 days    I have spent 40 minutes face to face with the patient and on the ward discussing the patients care, assessment, plan and disposition with other care givers. >50% of the time was devoted counseling the patient about the risks and benefits of treatment and coordinating care.     Renley Banwart Joline Maxcy, MD [facility] Pager [phone]   If 7PM-7AM, please contact night-coverage [email or web address] Password [facility] 02/10/2018, 11:39 AM

## 2018-02-10 NOTE — Care Management Important Message (Signed)
Important Message  Patient Details  Name: Jenna Carson MRN: [id number] Date of Birth: 05-07-1935   Medicare Important Message Given:  Yes    [name] [name] 02/10/2018, 3:43 PM

## 2018-02-11 LAB — CBC
HCT: 36.4 % (ref 36.0–46.0)
HEMOGLOBIN: 12.3 g/dL (ref 12.0–15.0)
MCH: 31.5 pg (ref 26.0–34.0)
MCHC: 33.8 g/dL (ref 30.0–36.0)
MCV: 93.1 fL (ref 78.0–100.0)
PLATELETS: 298 10*3/uL (ref 150–400)
RBC: 3.91 MIL/uL (ref 3.87–5.11)
RDW: 12.9 % (ref 11.5–15.5)
WBC: 12.8 10*3/uL — ABNORMAL HIGH (ref 4.0–10.5)

## 2018-02-11 LAB — COMPREHENSIVE METABOLIC PANEL
ALK PHOS: 69 U/L (ref 38–126)
ALT: 27 U/L (ref 0–44)
ANION GAP: 10 (ref 5–15)
AST: 32 U/L (ref 15–41)
Albumin: 3 g/dL — ABNORMAL LOW (ref 3.5–5.0)
BUN: 16 mg/dL (ref 8–23)
CO2: 27 mmol/L (ref 22–32)
Calcium: 9 mg/dL (ref 8.9–10.3)
Chloride: 104 mmol/L (ref 98–111)
Creatinine, Ser: 0.64 mg/dL (ref 0.44–1.00)
GFR calc Af Amer: >60 mL/min (ref >60)
GFR calc non Af Amer: >60 mL/min (ref >60)
GLUCOSE: 123 mg/dL — AB (ref 70–99)
Potassium: 3.8 mmol/L (ref 3.5–5.1)
SODIUM: 141 mmol/L (ref 135–145)
Total Bilirubin: 0.5 mg/dL (ref 0.3–1.2)
Total Protein: 6.1 g/dL — ABNORMAL LOW (ref 6.5–8.1)

## 2018-02-11 LAB — MAGNESIUM: Magnesium: 1.9 mg/dL (ref 1.7–2.4)

## 2018-02-11 LAB — CULTURE, RESPIRATORY W GRAM STAIN: Culture: NORMAL

## 2018-02-11 LAB — CULTURE, RESPIRATORY

## 2018-02-11 MED ORDER — IPRATROPIUM-ALBUTEROL 0.5-2.5 (3) MG/3ML IN SOLN: 3.0000 mL | Freq: Four times a day (QID) | RESPIRATORY_TRACT | 1 refills | Status: AC

## 2018-02-11 MED ORDER — ALBUTEROL SULFATE HFA 108 (90 BASE) MCG/ACT IN AERS: 2.0000 | INHALATION_SPRAY | Freq: Four times a day (QID) | RESPIRATORY_TRACT | 1 refills | Status: AC | PRN

## 2018-02-11 MED ORDER — DOXYCYCLINE HYCLATE 100 MG PO CAPS: 100.0000 mg | ORAL_CAPSULE | Freq: Two times a day (BID) | ORAL | 0 refills | Status: AC

## 2018-02-11 MED ORDER — BUDESONIDE 0.5 MG/2ML IN SUSP: 0.5000 mg | Freq: Two times a day (BID) | RESPIRATORY_TRACT | 0 refills | Status: AC

## 2018-02-11 MED ORDER — PREDNISONE 20 MG PO TABS: ORAL_TABLET | ORAL | 0 refills | Status: AC

## 2018-02-11 NOTE — Discharge Summary (Signed)
Physician Discharge Summary  Jenna Carson ZOX:[id number] DOB: Jun 14, 1935 DOA: 02/07/2018  PCP: Roland Rack, MD  Admit date: 02/07/2018 Discharge date: 02/11/2018  Admitted From: Home Disposition: Home  Recommendations for Outpatient Follow-up:  1. Follow up with PCP in 1 weeks 2. Please obtain BMP/CBC in one week your next doctors visit.  3. Prolonged taper course of prednisone over 15 days 4. New home oxygen, 3 L.  Over time this can be assessed if it can be weaned off.  She would benefit from outpatient pulmonary referral 5. Duo nebs, rescue inhalers and Pulmicort has been prescribed 6. Oral doxycycline for 7 days 7. Advised to quit smoking and definitely advised against any type of smoke around the oxygen.  Home Health: RN Equipment/Devices: Oxygen Discharge Condition: Stable CODE STATUS: Full code Diet recommendation: Regular  Brief/Interim Summary: 83 yo with pmhx of OA, possible underlying COPD (not officially dx), Previous tobacco use came to the hospital with complaints of SOB. She was found to be in COPD exac and CXR was Suggestive of LLL PNA. She was started on routine COPD Tx along with Antibiotics.  During the hospitalization patient was on IV Rocephin and azithromycin.  She tolerated IV Solu-Medrol, Pulmicort and routine bronchodilators well.  She also received 1 dose of IV Lasix given signs of some volume overload.  She routinely use incentive spirometry and flutter valve. On the day of discharge patient and the family insisted that they want to go home and can get rest of the treatment at home.  At their request arrangement for home oxygen, nebulizers and bronchodilators were arranged. During the hospitalization she also reported of some vaginal itching and concerning for whitish discharge therefore 1 dose of Diflucan was given.  Patient is medically doing better and can finish rest of the treatment at home.  I have advised her to follow-up outpatient recommendations as  stated above.   Discharge Diagnoses:  Principal Problem:   Community acquired pneumonia of left lower lobe of lung (HCC) Active Problems:   Acute respiratory failure with hypoxia (HCC)   Tachycardia   Malnutrition of moderate degree   Pressure injury of skin  Acute Respiratory Distress with hypoxia, 81%on RA. 3L [place]; improving Acute COPD exac, Moderate, improving Left Lower Lobe Community Acquired PNA Active tobacco use -Patient wishes to go home at this time.  I will change her antibiotics IV Rocephin and azithromycin to 7 days of oral doxycycline.  She will get prolonged course of prednisone taper over 15 days.  Pulmicort twice daily, scheduled and as needed bronchodilators prescribed.  Advised to take on incentive spirometry and flutter valve.  Follow-up outpatient with primary care physician and will benefit from also follow-up with pulmonary outpatient.  Home oxygen arrangements made, advised to not to smoke at home and definitely not around the oxygen.  Vaginal Itching concerning for Candida vaginitis -Status post 1 dose of Diflucan in the hospital  Patient on Lovenox for DVT prophylaxis here Daughter at bedside during my discussion about discharge  Discharge Instructions   Allergies as of 02/11/2018   No Known Allergies     Medication List    TAKE these medications   acetaminophen-codeine 300-30 MG tablet Commonly known as:  TYLENOL #3 Take 1 tablet by mouth at bedtime as needed for moderate pain.   albuterol 108 (90 Base) MCG/ACT inhaler Commonly known as:  PROVENTIL HFA;VENTOLIN HFA Inhale 2 puffs into the lungs every 6 (six) hours as needed for wheezing or shortness of breath.   budesonide  0.5 MG/2ML nebulizer solution Commonly known as:  PULMICORT Take 2 mLs (0.5 mg total) by nebulization 2 (two) times daily.   doxycycline 100 MG capsule Commonly known as:  VIBRAMYCIN Take 1 capsule (100 mg total) by mouth 2 (two) times daily for 7 days.    ipratropium-albuterol 0.5-2.5 (3) MG/3ML Soln Commonly known as:  DUONEB Take 3 mLs by nebulization 4 (four) times daily.   LORazepam 0.5 MG tablet Commonly known as:  ATIVAN Take 0.5 mg by mouth 2 (two) times daily as needed for anxiety.   predniSONE 20 MG tablet Commonly known as:  DELTASONE Take 2 tablets (40 mg total) by mouth 2 (two) times daily with a meal for 3 days, THEN 2 tablets (40 mg total) daily with breakfast for 3 days, THEN 1.5 tablets (30 mg total) daily with breakfast for 3 days, THEN 1 tablet (20 mg total) daily with breakfast for 3 days, THEN 0.5 tablets (10 mg total) daily with breakfast for 3 days. Start taking on:  02/11/2018   Vitamin D 2000 units tablet Take 2,000 Units by mouth daily.            Durable Medical Equipment  (From admission, onward)         Start     Ordered   02/11/18 [phone]  For home use only DME Nebulizer machine  Once    Question:  Patient needs a nebulizer to treat with the following condition  Answer:  COPD exacerbation (HCC)   02/11/18 1478   02/11/18 0937  For home use only DME oxygen  Once    Question Answer Comment  Mode or (Route) Nasal cannula   Liters per Minute 3   Frequency Continuous (stationary and portable oxygen unit needed)   Oxygen conserving device Yes   Oxygen delivery system Gas      02/11/18 0937         Follow-up Information    Roland Rack, MD. Schedule an appointment as soon as possible for a visit in 1 week(s).   Specialty:  Family Medicine Contact information: [address] [place] [address] [phone]          No Known Allergies  You were cared for by a hospitalist during your hospital stay. If you have any questions about your discharge medications or the care you received while you were in the hospital after you are discharged, you can call the unit and asked to speak with the hospitalist on call if the hospitalist that took care of you is not available. Once you are  discharged, your primary care physician will handle any further medical issues. Please note that no refills for any discharge medications will be authorized once you are discharged, as it is imperative that you return to your primary care physician (or establish a relationship with a primary care physician if you do not have one) for your aftercare needs so that they can reassess your need for medications and monitor your lab values.  Consultations:  none   Procedures/Studies: Dg Chest Portable 1 View  Result Date: 02/07/2018 CLINICAL DATA:  83 y/o  F; 4 days of shortness of breath. EXAM: PORTABLE CHEST 1 VIEW COMPARISON:  08/22/2011 chest radiograph. FINDINGS: Normal cardiac silhouette. Aortic atherosclerosis with calcification. Left lower lung zone streaky consolidation probably representing pneumonia. Stable hyperinflation of lungs and emphysema. No pleural effusion or pneumothorax. No acute osseous abnormality is evident. IMPRESSION: COPD. Left lower lung zone consolidation likely representing pneumonia. Followup PA and lateral  chest X-ray is recommended in 3-4 weeks following trial of antibiotic therapy to ensure resolution and exclude underlying malignancy. Electronically Signed   By: Mitzi Hansen M.D.   On: 02/07/2018 17:39      Subjective: No complaints, states she is feeling little better today.  Her daughter wants to take the patient home and finish rest of the treatment at home.  General = no fevers, chills, dizziness, malaise, fatigue HEENT/EYES = negative for pain, redness, loss of vision, double vision, blurred vision, loss of hearing, sore throat, hoarseness, dysphagia Cardiovascular= negative for chest pain, palpitation, murmurs, lower extremity swelling Respiratory/lungs= negative for shortness of breath, cough, hemoptysis, wheezing, mucus production Gastrointestinal= negative for nausea, vomiting,, abdominal pain, melena, hematemesis Genitourinary= negative for  Dysuria, Hematuria, Change in Urinary Frequency MSK = Negative for arthralgia, myalgias, Back Pain, Joint swelling  Neurology= Negative for headache, seizures, numbness, tingling  Psychiatry= Negative for anxiety, depression, suicidal and homocidal ideation Allergy/Immunology= Medication/Food allergy as listed  Skin= Negative for Rash, lesions, ulcers, itching   Discharge Exam: Vitals:   02/11/18 0417 02/11/18 0923  BP: (!) 140/106   Pulse: 74   Resp:    Temp: 98.3 F (36.8 C)   SpO2: 91% 98%   Vitals:   02/10/18 2005 02/10/18 2046 02/11/18 0417 02/11/18 0923  BP:  (!) 111/92 (!) 140/106   Pulse:  87 74   Resp:  (!) 22    Temp:  98 F (36.7 C) 98.3 F (36.8 C)   TempSrc:  Oral Oral   SpO2: 99% 97% 91% 98%  Weight:      Height:        General: Pt is alert, awake, not in acute distress Cardiovascular: RRR, S1/S2 +, no rubs, no gallops Respiratory: Slightly diminished breath sounds throughout. Abdominal: Soft, NT, ND, bowel sounds + Extremities: no edema, no cyanosis    The results of significant diagnostics from this hospitalization (including imaging, microbiology, ancillary and laboratory) are listed below for reference.     Microbiology: Recent Results (from the past 240 hour(s))  Culture, blood (Routine x 2)     Status: None (Preliminary result)   Collection Time: 02/07/18  4:55 PM  Result Value Ref Range Status   Specimen Description   Final    BLOOD BLOOD RIGHT FOREARM Performed at [facility], [address] [place], [place] [address]    Special Requests   Final    BOTTLES DRAWN AEROBIC AND ANAEROBIC Blood Culture adequate volume Performed at [facility], [address] [place], [place] [address]    Culture   Final    NO GROWTH 3 DAYS Performed at [facility], [address], [place], [place] [address]    Report Status PENDING  Incomplete  Culture, blood (Routine x 2)     Status: None (Preliminary result)   Collection  Time: 02/07/18  5:00 PM  Result Value Ref Range Status   Specimen Description   Final    BLOOD BLOOD LEFT FOREARM Performed at [facility], [address] [place], [place] [address]    Special Requests   Final    BOTTLES DRAWN AEROBIC AND ANAEROBIC Blood Culture adequate volume Performed at [facility], [address] [place], [place] [address]    Culture   Final    NO GROWTH 3 DAYS Performed at [facility], [address], [place], [place] [address]    Report Status  PENDING  Incomplete  Urine culture     Status: Abnormal   Collection Time: 02/07/18  6:13 PM  Result Value Ref Range Status   Specimen Description   Final    URINE, RANDOM Performed at [facility], [address] [place], [place] [address]    Special Requests   Final    NONE Performed at [facility], [address] [place], [place] [address]    Culture MULTIPLE SPECIES PRESENT, SUGGEST RECOLLECTION (A)  Final   Report Status 02/09/2018 FINAL  Final  Culture, sputum-assessment     Status: None   Collection Time: 02/08/18  5:12 PM  Result Value Ref Range Status   Specimen Description EXPECTORATED SPUTUM  Final   Special Requests NONE  Final   Sputum evaluation   Final    THIS SPECIMEN IS ACCEPTABLE FOR SPUTUM CULTURE Performed at [facility], [address], [place], [place] [address]    Report Status 02/08/2018 FINAL  Final  Culture, respiratory     Status: None   Collection Time: 02/08/18  5:12 PM  Result Value Ref Range Status   Specimen Description EXPECTORATED SPUTUM  Final   Special Requests NONE Reflexed from [id number]  Final   Gram Stain   Final    MODERATE WBC PRESENT, PREDOMINANTLY PMN MODERATE GRAM POSITIVE COCCI IN CLUSTERS FEW GRAM NEGATIVE RODS FEW GRAM POSITIVE RODS    Culture   Final    FEW Consistent with normal respiratory flora. Performed at [facility], [address], [place], [place] [address]    Report Status  02/11/2018 FINAL  Final     Labs: BNP (last 3 results) Recent Labs    02/10/18 0757  BNP 414.2*   Basic Metabolic Panel: Recent Labs  Lab 02/07/18 1657 02/08/18 0057 02/10/18 0757 02/11/18 0530  NA 138 142 141 141  K 3.5 4.4 4.0 3.8  CL 102 106 108 104  CO2 25 29 28 27   GLUCOSE 176* 122* 148* 123*  BUN 18 13 10 16   CREATININE 0.74 0.66 0.55 0.64  CALCIUM 8.7* 8.6* 8.9 9.0  MG  --   --  2.0 1.9   Liver Function Tests: Recent Labs  Lab 02/07/18 1657 02/10/18 0757 02/11/18 0530  AST 24 29 32  ALT 15 23 27   ALKPHOS 77 71 69  BILITOT 0.9 0.4 0.5  PROT 7.2 6.0* 6.1*  ALBUMIN 3.4* 2.8* 3.0*   No results for input(s): LIPASE, AMYLASE in the last 168 hours. No results for input(s): AMMONIA in the last 168 hours. CBC: Recent Labs  Lab 02/07/18 1657 02/08/18 0057 02/10/18 0757 02/11/18 0530  WBC 11.5* 8.5 10.1 12.8*  NEUTROABS 9.3*  --   --   --   HGB 13.4 11.8* 11.6* 12.3  HCT 38.3 35.9* 35.6* 36.4  MCV 92.7 96.0 94.2 93.1  PLT 257 229 263 298   Cardiac Enzymes: No results for input(s): CKTOTAL, CKMB, CKMBINDEX, TROPONINI in the last 168 hours. BNP: Invalid input(s): POCBNP CBG: No results for input(s): GLUCAP in the last 168 hours. D-Dimer No results for input(s): DDIMER in the last 72 hours. Hgb A1c No results for input(s): HGBA1C in the last 72 hours. Lipid Profile No results for input(s): CHOL, HDL, LDLCALC, TRIG, CHOLHDL, LDLDIRECT in the last 72 hours. Thyroid function studies No results for input(s): TSH, T4TOTAL, T3FREE, THYROIDAB in the last 72 hours.  Invalid input(s): FREET3 Anemia work up No results for input(s):  VITAMINB12, FOLATE, FERRITIN, TIBC, IRON, RETICCTPCT in the last 72 hours. Urinalysis    Component Value Date/Time   COLORURINE YELLOW 02/07/2018 1813   APPEARANCEUR CLOUDY (A) 02/07/2018 1813   LABSPEC >1.030 (H) 02/07/2018 1813   PHURINE 6.0 02/07/2018 1813   GLUCOSEU NEGATIVE 02/07/2018 1813   HGBUR SMALL (A) 02/07/2018  1813   BILIRUBINUR SMALL (A) 02/07/2018 1813   KETONESUR 15 (A) 02/07/2018 1813   PROTEINUR 100 (A) 02/07/2018 1813   UROBILINOGEN 1.0 08/22/2011 2124   NITRITE NEGATIVE 02/07/2018 1813   LEUKOCYTESUR SMALL (A) 02/07/2018 1813   Sepsis Labs Invalid input(s): PROCALCITONIN,  WBC,  LACTICIDVEN Microbiology Recent Results (from the past 240 hour(s))  Culture, blood (Routine x 2)     Status: None (Preliminary result)   Collection Time: 02/07/18  4:55 PM  Result Value Ref Range Status   Specimen Description   Final    BLOOD BLOOD RIGHT FOREARM Performed at [facility] [place] [facility], [address] [place], [place] [address]    Special Requests   Final    BOTTLES DRAWN AEROBIC AND ANAEROBIC Blood Culture adequate volume Performed at [facility] [place], [address] [place], [place] [address]    Culture   Final    NO GROWTH 3 DAYS Performed at [facility], [address] [place], [place] [address]    Report Status PENDING  Incomplete  Culture, blood (Routine x 2)     Status: None (Preliminary result)   Collection Time: 02/07/18  5:00 PM  Result Value Ref Range Status   Specimen Description   Final    BLOOD BLOOD LEFT FOREARM Performed at [facility] [place], [address] [place], [place] [address]    Special Requests   Final    BOTTLES DRAWN AEROBIC AND ANAEROBIC Blood Culture adequate volume Performed at [facility] [place], [address] [place], [place] [address]    Culture   Final    NO GROWTH 3 DAYS Performed at [facility], [address] [place], [place] [address]    Report Status PENDING  Incomplete  Urine culture     Status: Abnormal   Collection Time: 02/07/18  6:13 PM  Result Value Ref Range Status   Specimen Description   Final    URINE, RANDOM Performed at [facility] [place], [address] [place], [place] [address]    Special Requests   Final    NONE Performed at [facility] [place], [address] [place], [place] [address]    Culture MULTIPLE SPECIES PRESENT, SUGGEST RECOLLECTION (A)  Final   Report Status 02/09/2018 FINAL  Final  Culture, sputum-assessment     Status: None   Collection Time: 02/08/18  5:12 PM  Result Value Ref Range Status   Specimen Description EXPECTORATED SPUTUM  Final   Special Requests NONE  Final   Sputum evaluation   Final    THIS SPECIMEN IS ACCEPTABLE FOR SPUTUM CULTURE Performed at [facility], [address] [place], [place] [address]    Report Status 02/08/2018 FINAL  Final  Culture, respiratory     Status: None   Collection Time: 02/08/18  5:12 PM  Result Value Ref Range Status   Specimen Description EXPECTORATED SPUTUM  Final   Special Requests NONE Reflexed from [id number]  Final   Gram Stain   Final    MODERATE WBC PRESENT, PREDOMINANTLY PMN MODERATE GRAM POSITIVE COCCI  IN CLUSTERS FEW GRAM NEGATIVE RODS FEW GRAM POSITIVE RODS    Culture   Final    FEW Consistent with normal respiratory flora. Performed at [facility], [address], [place], [place] [address]    Report Status 02/11/2018 FINAL  Final     Time coordinating discharge:  I have spent 35 minutes face to face with the patient and on the ward discussing the patients care, assessment, plan and disposition with other care givers. >50% of the time was devoted counseling the patient about the risks and benefits of treatment/Discharge disposition and coordinating care.   SIGNED:   Dimple Nanas, MD  [facility] 02/11/2018, 10:17 AM Pager   If 7PM-7AM, please contact night-coverage [email or web address] Password [email or web address]

## 2018-02-11 NOTE — Progress Notes (Signed)
Pt given discharge instructions and prescriptions.  Daughter is present.  Disconnected and notified tele.  Pt is anxious about discharge and is asking if she can stay until the afternoon.  Right now discharge is complete we are just waiting on her home Oxygen.

## 2018-02-11 NOTE — Progress Notes (Signed)
Pt given discharge instructions, prescriptions, and care notes. Pt verbalized understanding AEB no further questions or concerns at this time. IV was discontinued, no redness, pain, or swelling noted at this time. Telemetry discontinued and Centralized Telemetry was notified. Pt left the floor via wheelchair with staff in stable condition. 

## 2018-02-11 NOTE — Care Management Note (Signed)
Case Management Note  Patient Details  Name: Jenna Carson MRN: [id number] Date of Birth: 10-Oct-1934  Subjective/Objective:           Pt from home with daughter for pneumonia and COPD exacerbation.  Pt independent with ADL's and has no DME at home.  Pt to go home on O2 .       Action/Plan: [facility].  J[facility] with [facility] aware.  Expected Discharge Date:  02/11/18               Expected Discharge Plan:  Home w Home Health Services  In-House Referral:  NA  Discharge planning Services  CM Consult  Post Acute Care Choice:  Durable Medical Equipment, Home Health Choice offered to:  Patient  DME Arranged:  Oxygen DME Agency:  [facility]  HH Arranged:  RN, PT, OT, Nurse's Aide(RT) HH Agency:  [facility]  Status of Service:  Completed, signed off  If discussed at Long Length of Stay Meetings, dates discussed:    Additional Comments:  Deveron Furlongshley  Jamespaul Secrist, RN 02/11/2018, 3:21 PM

## 2018-02-12 LAB — CULTURE, BLOOD (ROUTINE X 2)
Culture: NO GROWTH
Culture: NO GROWTH
SPECIAL REQUESTS: ADEQUATE
Special Requests: ADEQUATE

## 2020-04-19 IMAGING — DX DG CHEST 1V PORT
1 series · 1 of 1 positions shown · non-contrast
Comparison: 08/22/2011 chest radiograph.

CLINICAL DATA: 82 y/o  F; 4 days of shortness of breath.

EXAM:
PORTABLE CHEST 1 VIEW

[chest ap]
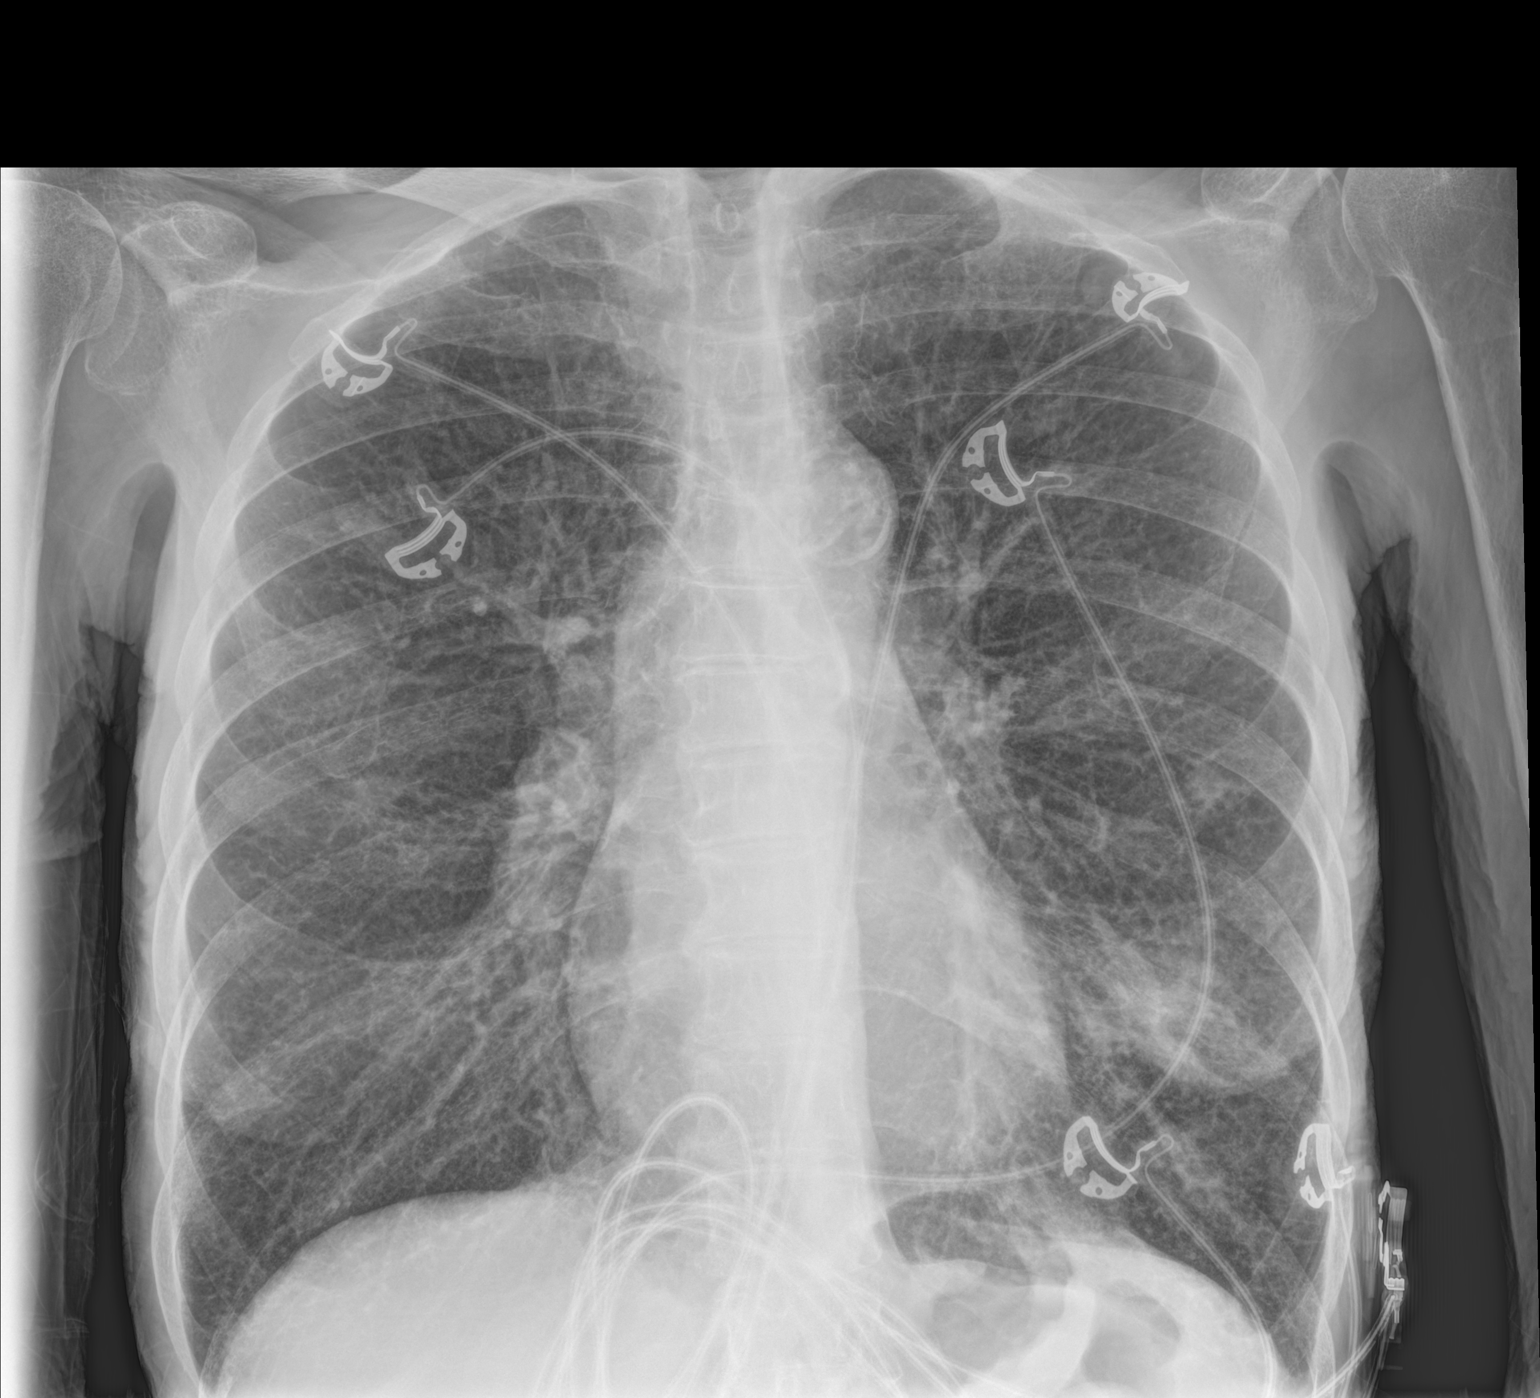

[1 of 1 positions shown; findings below may reference images not displayed]

FINDINGS: Normal cardiac silhouette. Aortic atherosclerosis with
calcification. Left lower lung zone streaky consolidation probably
representing pneumonia. Stable hyperinflation of lungs and
emphysema. No pleural effusion or pneumothorax. No acute osseous
abnormality is evident.
IMPRESSION: COPD. Left lower lung zone consolidation likely representing
pneumonia. Followup PA and lateral chest X-ray is recommended in 3-4
weeks following trial of antibiotic therapy to ensure resolution and
exclude underlying malignancy.

By: Tim[place]kka Bertell M.D.

## 2020-06-21 DEATH — deceased
